# Patient Record
Sex: Female | Born: 1970 | Race: Black or African American | Hispanic: No | Marital: Single | Smoking: Never smoker
Health system: Southern US, Community
[De-identification: ages and names within clinical notes are randomized; demographics above are authoritative.]

## PROBLEM LIST (undated history)

## (undated) DIAGNOSIS — E119 Type 2 diabetes mellitus without complications: Secondary | ICD-10-CM

## (undated) DIAGNOSIS — E7800 Pure hypercholesterolemia, unspecified: Secondary | ICD-10-CM

## (undated) DIAGNOSIS — G43909 Migraine, unspecified, not intractable, without status migrainosus: Secondary | ICD-10-CM

## (undated) DIAGNOSIS — J309 Allergic rhinitis, unspecified: Secondary | ICD-10-CM

## (undated) DIAGNOSIS — J45909 Unspecified asthma, uncomplicated: Secondary | ICD-10-CM

## (undated) DIAGNOSIS — Z21 Asymptomatic human immunodeficiency virus [HIV] infection status: Secondary | ICD-10-CM

## (undated) DIAGNOSIS — B20 Human immunodeficiency virus [HIV] disease: Secondary | ICD-10-CM

## (undated) HISTORY — DX: Type 2 diabetes mellitus without complications: E11.9

## (undated) HISTORY — DX: Pure hypercholesterolemia, unspecified: E78.00

## (undated) HISTORY — DX: Human immunodeficiency virus (HIV) disease: B20

## (undated) HISTORY — PX: ADENOIDECTOMY: SUR15

## (undated) HISTORY — DX: Unspecified asthma, uncomplicated: J45.909

## (undated) HISTORY — DX: Migraine, unspecified, not intractable, without status migrainosus: G43.909

## (undated) HISTORY — PX: UTERINE FIBROID SURGERY: SHX826

## (undated) HISTORY — DX: Allergic rhinitis, unspecified: J30.9

## (undated) HISTORY — DX: Asymptomatic human immunodeficiency virus (hiv) infection status: Z21

---

## 2001-01-24 ENCOUNTER — Other Ambulatory Visit: Admission: RE | Admit: 2001-01-24 | Discharge: 2001-01-24 | Payer: Self-pay | Admitting: Obstetrics and Gynecology

## 2011-04-01 DIAGNOSIS — B20 Human immunodeficiency virus [HIV] disease: Secondary | ICD-10-CM | POA: Insufficient documentation

## 2012-10-25 DIAGNOSIS — M545 Low back pain, unspecified: Secondary | ICD-10-CM | POA: Insufficient documentation

## 2014-03-11 DIAGNOSIS — N939 Abnormal uterine and vaginal bleeding, unspecified: Secondary | ICD-10-CM | POA: Insufficient documentation

## 2015-03-05 ENCOUNTER — Other Ambulatory Visit: Payer: Self-pay | Admitting: Allergy

## 2015-03-05 ENCOUNTER — Telehealth: Payer: Self-pay | Admitting: Allergy

## 2015-03-05 NOTE — Telephone Encounter (Signed)
singulair approval faxed to [facility]

## 2015-03-05 NOTE — Telephone Encounter (Signed)
singulair has been approved for 2017

## 2015-04-16 ENCOUNTER — Encounter: Payer: Self-pay | Admitting: Pediatrics

## 2015-04-16 ENCOUNTER — Ambulatory Visit (INDEPENDENT_AMBULATORY_CARE_PROVIDER_SITE_OTHER): Payer: Medicare Other | Admitting: Pediatrics

## 2015-04-16 VITALS — BP 120/74 | HR 90 | Temp 98.4°F | Resp 17 | Ht 61.0 in | Wt 208.1 lb

## 2015-04-16 DIAGNOSIS — B20 Human immunodeficiency virus [HIV] disease: Secondary | ICD-10-CM

## 2015-04-16 DIAGNOSIS — J3089 Other allergic rhinitis: Secondary | ICD-10-CM | POA: Insufficient documentation

## 2015-04-16 DIAGNOSIS — L259 Unspecified contact dermatitis, unspecified cause: Secondary | ICD-10-CM | POA: Diagnosis not present

## 2015-04-16 DIAGNOSIS — J4540 Moderate persistent asthma, uncomplicated: Secondary | ICD-10-CM

## 2015-04-16 DIAGNOSIS — Z21 Asymptomatic human immunodeficiency virus [HIV] infection status: Secondary | ICD-10-CM

## 2015-04-16 LAB — PULMONARY FUNCTION TEST

## 2015-04-16 MED ORDER — MOMETASONE FUROATE 50 MCG/ACT NA SUSP: NASAL | Status: DC

## 2015-04-16 NOTE — Patient Instructions (Signed)
Continue Qvar 80 one puff twice a day Pro-air 2 puffs every 4 hours if needed for wheezing or coughing spells Montelukast -Singulair 10 mg once a day Instead of fluticasone nose spray try Nasonex 1 spray per nostril twice a day Hydroxyzine 25 mg once a day for runny nose or itching 1% hydrocortisone cream twice a day if needed to red itchy areas on the face

## 2015-04-16 NOTE — Progress Notes (Signed)
  [address] [place] [address] Dept: [phone]  FOLLOW UP NOTE  Patient ID: Amber Clark, female    DOB: 01-Oct-1970  Age: 46 y.o. MRN: [id number] Date of Office Visit: 04/16/2015  Assessment Chief Complaint: Asthma and Wheezing  HPI Ilamae Geng presents for follow-up of her asthma and allergic rhinitis. Her asthma is well controlled. Fluticasone does not help her nasal congestion as well as before.. She has been having a an itchy rash under the lower eyelids. She is not using hydroxyzine. Her HIV is well controlled and she does not have a viral load . Her diabetes is well controlled  Current medications are Singulair 10 mg once a day, Pro-air 2 puffs every 4 hours if needed, Qvar 80-one puff twice a day and fluticasone 1 spray per nostril twice a day. Her other medications are outlined in the chart   Drug Allergies:  Allergies  Allergen Reactions  . Sulfa Antibiotics Hives  . Ace Inhibitors Other (See Comments)    Makes heart race  . Lisinopril Hives  . Penicillins Other (See Comments)    Makes her feel bad.  . Codeine Itching    Physical Exam: BP 120/74 mmHg  Pulse 90  Temp(Src) 98.4 F (36.9 C) (Oral)  Resp 17  Ht  (1.549 m)  Wt 208 lb 1.8 oz (94.4 kg)  BMI 39.34 kg/m2   Physical Exam  Constitutional: She is oriented to person, place, and time. She appears well-developed and well-nourished.  HENT:  Eyes normal. Ears normal. Nose normal. Pharynx normal.  Neck: Neck supple.  Cardiovascular:  S1 and S2 normal no murmurs  Pulmonary/Chest:  Clear to percussion auscultation  Lymphadenopathy:    She has no cervical adenopathy.  Neurological: She is alert and oriented to person, place, and time.  Skin:  Mild erythema below lower eyelids  Psychiatric: She has a normal mood and affect. Her behavior is normal. Judgment and thought content normal.  Vitals reviewed.   Diagnostics:   FVC 1.72 L FEV1 1.48 L. Predicted FVC 2.72 L predicted FEV1 2.24  L-E shows a moderate reduction in the FVC  Assessment and Plan: 1. Moderate persistent asthma, uncomplicated   2. Other allergic rhinitis   3. HIV (human immunodeficiency virus infection) (HCC)   4. Dermatitis, contact   4.      Insulin-dependent diabetes  Meds ordered this encounter  Medications  . mometasone (NASONEX) 50 MCG/ACT nasal spray    Sig: Use 1 Spray Each Nostril Twice a Day    Dispense:  16 g    Refill:  5    Patient Instructions  Continue Qvar 80 one puff twice a day Pro-air 2 puffs every 4 hours if needed for wheezing or coughing spells Montelukast -Singulair 10 mg once a day Instead of fluticasone nose spray try Nasonex 1 spray per nostril twice a day Hydroxyzine 25 mg once a day for runny nose or itching 1% hydrocortisone cream twice a day if needed to red itchy areas on the face    Return in about 6 weeks (around 05/28/2015).    Thank you for the opportunity to care for this patient.  Please do not hesitate to contact me with questions.  Tonette Bihari, M.D.  [facility] [address] [place], [place] [address] [phone]

## 2015-05-08 ENCOUNTER — Other Ambulatory Visit: Payer: Self-pay

## 2015-05-08 MED ORDER — SINGULAIR 10 MG PO TABS: ORAL_TABLET | ORAL | Status: DC

## 2015-05-28 ENCOUNTER — Ambulatory Visit: Payer: Medicaid Other | Admitting: Pediatrics

## 2015-06-02 ENCOUNTER — Ambulatory Visit (INDEPENDENT_AMBULATORY_CARE_PROVIDER_SITE_OTHER): Payer: Medicare Other | Admitting: Pediatrics

## 2015-06-02 ENCOUNTER — Encounter: Payer: Self-pay | Admitting: Pediatrics

## 2015-06-02 VITALS — BP 102/60 | HR 82 | Temp 98.4°F | Resp 18

## 2015-06-02 DIAGNOSIS — Z21 Asymptomatic human immunodeficiency virus [HIV] infection status: Secondary | ICD-10-CM

## 2015-06-02 DIAGNOSIS — B20 Human immunodeficiency virus [HIV] disease: Secondary | ICD-10-CM

## 2015-06-02 DIAGNOSIS — J3089 Other allergic rhinitis: Secondary | ICD-10-CM | POA: Diagnosis not present

## 2015-06-02 DIAGNOSIS — J4540 Moderate persistent asthma, uncomplicated: Secondary | ICD-10-CM

## 2015-06-02 NOTE — Progress Notes (Signed)
  [address] [place] [address] Dept: [phone]  FOLLOW UP NOTE  Patient ID: Amber GlassingLeslie Verrilli, female    DOB: 03/17/1971  Age: 45 y.o. MRN: [id number] Date of Office Visit: 06/02/2015  Assessment Chief Complaint: Asthma  HPI Amber GlassingLeslie Nott presents for follow-up of asthma and allergic rhinitis. Her asthma has been well controlled. At times she has mild nasal congestion. Her HIV is well controlled and she does not have viral particles. Her diabetes is well controlled with insulin  Current medications are Qvar 80-one  puff twice a day, Pro-air 2 puffs every 4 hours if needed, montelukast 10 mg once a day, Nasonex 1 spray per nostril twice a day if needed and hydroxyzine 25 mg at night. Her other medications are outlined in the chart..   Drug Allergies:  Allergies  Allergen Reactions  . Sulfa Antibiotics Hives  . Ace Inhibitors Other (See Comments)    Makes heart race  . Lisinopril Hives  . Penicillins Other (See Comments)    Makes her feel bad.  . Codeine Itching    Physical Exam: BP 102/60 mmHg  Pulse 82  Temp(Src) 98.4 F (36.9 C) (Oral)  Resp 18   Physical Exam  Constitutional: She is oriented to person, place, and time. She appears well-developed and well-nourished.  HENT:  Eyes normal. Ears normal. Nose normal. Pharynx normal.  Neck: Neck supple.  Cardiovascular:  S1 and S2 normal no murmurs  Pulmonary/Chest:  Clear to percussion and auscultation  Lymphadenopathy:    She has no cervical adenopathy.  Neurological: She is alert and oriented to person, place, and time.  Skin:  Clear  Psychiatric: She has a normal mood and affect. Her behavior is normal. Judgment and thought content normal.  Vitals reviewed.   Diagnostics:   None  Assessment and Plan: 1. Moderate persistent asthma, uncomplicated   2. Other allergic rhinitis   3. HIV (human immunodeficiency virus infection) (HCC)   4.      Insulin-dependent diabetes      Patient Instructions    Continue on the treatment plan outlined above Call me if you're not doing well on this treatment plan    Return in about 1 year (around 06/01/2016).    Thank you for the opportunity to care for this patient.  Please do not hesitate to contact me with questions.  Tonette BihariJ. A. Bardelas, M.D.  [facility] [address] [place], [place] [address] [phone]

## 2015-06-02 NOTE — Patient Instructions (Signed)
Continue on the treatment plan outlined above Call me if you're not doing well on this treatment plan 

## 2015-07-12 ENCOUNTER — Emergency Department (HOSPITAL_BASED_OUTPATIENT_CLINIC_OR_DEPARTMENT_OTHER): Payer: Medicare Other

## 2015-07-12 ENCOUNTER — Emergency Department (HOSPITAL_BASED_OUTPATIENT_CLINIC_OR_DEPARTMENT_OTHER)
Admission: EM | Admit: 2015-07-12 | Discharge: 2015-07-12 | Disposition: A | Discharge status: Home / Self Care | Payer: Medicare Other | Attending: Emergency Medicine | Admitting: Emergency Medicine

## 2015-07-12 ENCOUNTER — Encounter (HOSPITAL_BASED_OUTPATIENT_CLINIC_OR_DEPARTMENT_OTHER): Payer: Self-pay | Admitting: Emergency Medicine

## 2015-07-12 DIAGNOSIS — Z880 Allergy status to penicillin: Secondary | ICD-10-CM | POA: Insufficient documentation

## 2015-07-12 DIAGNOSIS — Y998 Other external cause status: Secondary | ICD-10-CM | POA: Insufficient documentation

## 2015-07-12 DIAGNOSIS — W108XXA Fall (on) (from) other stairs and steps, initial encounter: Secondary | ICD-10-CM | POA: Diagnosis not present

## 2015-07-12 DIAGNOSIS — Y9301 Activity, walking, marching and hiking: Secondary | ICD-10-CM | POA: Insufficient documentation

## 2015-07-12 DIAGNOSIS — Z79899 Other long term (current) drug therapy: Secondary | ICD-10-CM | POA: Diagnosis not present

## 2015-07-12 DIAGNOSIS — J45909 Unspecified asthma, uncomplicated: Secondary | ICD-10-CM | POA: Insufficient documentation

## 2015-07-12 DIAGNOSIS — W19XXXA Unspecified fall, initial encounter: Secondary | ICD-10-CM

## 2015-07-12 DIAGNOSIS — Y92009 Unspecified place in unspecified non-institutional (private) residence as the place of occurrence of the external cause: Secondary | ICD-10-CM | POA: Diagnosis not present

## 2015-07-12 DIAGNOSIS — S99912A Unspecified injury of left ankle, initial encounter: Secondary | ICD-10-CM | POA: Diagnosis present

## 2015-07-12 DIAGNOSIS — Z794 Long term (current) use of insulin: Secondary | ICD-10-CM | POA: Insufficient documentation

## 2015-07-12 DIAGNOSIS — S92252A Displaced fracture of navicular [scaphoid] of left foot, initial encounter for closed fracture: Secondary | ICD-10-CM

## 2015-07-12 MED ORDER — IBUPROFEN 600 MG PO TABS: 600.0000 mg | ORAL_TABLET | Freq: Four times a day (QID) | ORAL | Status: DC | PRN

## 2015-07-12 MED ORDER — IBUPROFEN 400 MG PO TABS
600.0000 mg | ORAL_TABLET | Freq: Once | ORAL | Status: AC
  Administered 2015-07-12: 600 mg via ORAL
  Filled 2015-07-12: qty 1

## 2015-07-12 NOTE — ED Notes (Signed)
Patient reports that she fell down her steps, about 2 -3 steps. The patient reports that she hit her left side  Arm and leg pain

## 2015-07-12 NOTE — Discharge Instructions (Signed)
Take your medications as prescribed. I recommend rest, elevation and icing her foot for 15-20 minutes 3-4 times daily as needed for pain and swelling. Wear your splint for the next 7-10 days until your follow up with Orthopedics. Call the Orthopedic office listed above to schedule a follow up appointment in the next 7-10 days. Please return to the Emergency Department if symptoms worsen or new onset of fever, redness, swelling, warmth, numbness, tingling, weakness.

## 2015-07-12 NOTE — ED Notes (Signed)
Applied Ice pack.

## 2015-07-12 NOTE — ED Provider Notes (Signed)
CSN: [id number]     Arrival date & time 07/12/15  1037 History   First MD Initiated Contact with Patient 07/12/15 1101     Chief Complaint  Patient presents with  . Fall     (Consider location/radiation/quality/duration/timing/severity/associated sxs/prior Treatment) HPI   Patient is a 45 year old female past medical history of HIV, hypertension, diabetes who presents the ED status post fall with complaint of left ankle pain, onset prior to arrival. Patient reports she was walking down the steps at her home, tripped and fell down the last 2 steps resulting in her twisting and landing on her left ankle. Denies head injury or LOC. Patient reports having pain and swelling to her left ankle. Denies taking any medications prior to arrival. Denies fever, redness, warmth, numbness, tingling, weakness. Denies use of anticoagulants.   Past Medical History  Diagnosis Date  . Asthma    History reviewed. No pertinent past surgical history. History reviewed. No pertinent family history. Social History  Substance Use Topics  . Smoking status: Never Smoker   . Smokeless tobacco: Never Used  . Alcohol Use: No   OB History    No data available     Review of Systems  Musculoskeletal: Positive for joint swelling and arthralgias (left ankle).  Skin: Negative for wound.  Neurological: Negative for weakness and numbness.      Allergies  Sulfa antibiotics; Ace inhibitors; Lisinopril; Penicillins; and Codeine  Home Medications   Prior to Admission medications   Medication Sig Start Date End Date Taking? Authorizing Provider  ACCU-CHEK FASTCLIX LANCETS MISC CHECK SUGARS TID 04/08/15   Historical Provider, MD  amLODipine (NORVASC) 10 MG tablet TK 1 T PO  QD 03/24/15   Historical Provider, MD  clindamycin (CLEOCIN T) 1 % lotion APP AA OF FACE BID 02/18/15   Historical Provider, MD  diclofenac (VOLTAREN) 75 MG EC tablet TK 1 T PO BID WF FOR MENSTRUAL CRAMPS ON WORST DAYS OF PERIOD [facility] MONTH  03/03/15   Historical Provider, MD  ferrous sulfate 325 (65 FE) MG tablet TAKE 1 T PO ONCE D. 04/08/15   Historical Provider, MD  gabapentin (NEURONTIN) 300 MG capsule TK UP TO 2 CS PO ATN 03/21/15   Historical Provider, MD  ibuprofen (ADVIL,MOTRIN) 600 MG tablet Take 1 tablet (600 mg total) by mouth every 6 (six) hours as needed. 07/12/15   Barrett Henle, PA-C  Insulin Syringe-Needle U-100 (INSULIN SYRINGE .5CC/31GX5/16") 31G X 5/16" 0.5 ML MISC U UTD TO INJ D 03/24/15   Historical Provider, MD  ISENTRESS 400 MG tablet TK 1 T PO BID 02/24/15   Historical Provider, MD  LANTUS 100 UNIT/ML injection ADM 30 UNITS [place] QAM 03/02/15   Historical Provider, MD  Karlene Einstein 145 MCG CAPS capsule  04/08/15   Historical Provider, MD  Magnesium Oxide -Mg Supplement 250 MG TABS TK 1 T PO D 05/14/15   Historical Provider, MD  medroxyPROGESTERone (PROVERA) 10 MG tablet Reported on 06/02/2015 02/02/15   Historical Provider, MD  metFORMIN (GLUCOPHAGE) 500 MG tablet TK 2 T PO BID 04/06/15   Historical Provider, MD  mometasone (NASONEX) 50 MCG/ACT nasal spray Use 1 Spray Each Nostril Twice a Day 04/16/15   Fletcher Anon, MD  PROAIR HFA 108 (90 Base) MCG/ACT inhaler INL 2 PFS PO Q 4 TO 6 H PRF COUGH OR WHZ 01/31/15   Historical Provider, MD  promethazine (PHENERGAN) 25 MG tablet Take 25 mg by mouth every 6 (six) hours as needed for nausea or vomiting.  Historical Provider, MD  QVAR 80 MCG/ACT inhaler INHALE 2 PUFFS PO BID 01/30/15   Historical Provider, MD  ranitidine (ZANTAC) 300 MG capsule TK 1 C PO HS 03/31/15   Historical Provider, MD  rizatriptan (MAXALT-MLT) 10 MG disintegrating tablet Take 10 mg by mouth as needed for migraine. May repeat in 2 hours if needed    Historical Provider, MD  simvastatin (ZOCOR) 40 MG tablet TK 1 T PO D 02/26/15   Historical Provider, MD  SINGULAIR 10 MG tablet TK 1 T PO QD COU OR WHZ 05/08/15   Fletcher Anon, MD  TRUVADA 200-300 MG tablet TK 1 T PO D 02/26/15   Historical Provider, MD   valACYclovir (VALTREX) 1000 MG tablet TK 1 T PO D 02/23/15   Historical Provider, MD  VICTOZA 18 MG/3ML SOPN ADM 1.8 MG [place] D 04/08/15   Historical Provider, MD   BP 143/90 mmHg  Pulse 101  Temp(Src) 98.3 F (36.8 C) (Oral)  Resp 18  Ht  (1.549 m)  Wt 87.261 kg  BMI 36.37 kg/m2  SpO2 99%  LMP 07/05/2015 Physical Exam  Constitutional: She is oriented to person, place, and time. She appears well-developed and well-nourished.  HENT:  Head: Normocephalic and atraumatic. Head is without raccoon's eyes, without Battle's sign, without abrasion, without contusion and without laceration.  Eyes: Conjunctivae and EOM are normal. Right eye exhibits no discharge. Left eye exhibits no discharge. No scleral icterus.  Neck: Normal range of motion. Neck supple.  Cardiovascular: Normal rate.   HR 96  Pulmonary/Chest: Effort normal.  Musculoskeletal: She exhibits edema and tenderness.       Left ankle: She exhibits decreased range of motion (due to pain and swelling) and swelling. She exhibits no ecchymosis, no deformity, no laceration and normal pulse. Tenderness (anterior and posterior ankle). Lateral malleolus and medial malleolus tenderness found.  Sensation grossly intact. 2+ DP pulses. Cap refill <2. Pt able to wiggle toes. FROM of left knee. No TTP of left tib/fib or knee.   Neurological: She is alert and oriented to person, place, and time.  Skin: Skin is warm and dry.  Nursing note and vitals reviewed.   ED Course  Procedures (including critical care time) Labs Review Labs Reviewed - No data to display  Imaging Review Dg Tibia/fibula Left  07/12/2015  CLINICAL DATA:  45 year old female with history of trauma after a fall down some steps at home today complaining of pain in the left leg. EXAM: LEFT TIBIA AND FIBULA - 2 VIEW COMPARISON:  No priors. FINDINGS: Two views of the left tibia and fibula demonstrate no acute displaced fractures. Extensive soft tissue swelling overlying the  lateral malleolus. IMPRESSION: 1. No acute radiographic abnormality of the left tibia or fibula. Electronically Signed   By: Trudie Reed M.D.   On: 07/12/2015 12:18   Dg Ankle Complete Left  07/12/2015  CLINICAL DATA:  45 year old female with history of trauma from a fall down steps at home today complaining of pain in the left ankle. EXAM: LEFT ANKLE COMPLETE - 3+ VIEW COMPARISON:  No priors. FINDINGS: Tiny osseous fragment adjacent to the dorsal aspect of the proximal navicular bone, likely to represent a small avulsion fracture. Remaining bones of the ankle are otherwise intact. Extensive soft tissue swelling, most notable over the lateral malleolus. IMPRESSION: 1. Tiny dorsal avulsion fracture from the proximal aspect of the navicular bone. 2. Extensive soft tissue swelling overlying the lateral malleolus. Electronically Signed   By: Trudie Reed  M.D.   On: 07/12/2015 12:17   I have personally reviewed and evaluated these images and lab results as part of my medical decision-making.   EKG Interpretation None      MDM   Final diagnoses:  Fall, initial encounter  Left navicular fracture of foot, closed, initial encounter    Patient presents status post mechanical fall with reported left ankle pain and swelling. Denies head injury or LOC. VSS. Exam revealed mild swelling and TTP of left ankle with dec ROM, left foot/ankle otherwise neurovascularly intact. Pt given ice and ibuprofen in the ED. left tib-fib x-ray negative. Left ankle x-ray revealed tiny dorsal avulsion fracture from proximal navicular bone, soft tissue swelling of lateral malleolus. Plan to apply short leg splint on pt, advise pt to be non-weight bearing for 7-10 days until she follows up with ortho in 7-10 days. Pt given ortho follow up. Plan to d/c pt home with NSAIDs, RICE protocol and discussed symptomatic tx.   Evaluation does not show pathology requring ongoing emergent intervention or admission. Pt is  hemodynamically stable and mentating appropriately. Discussed findings/results and plan with patient/guardian, who agrees with plan. All questions answered. Return precautions discussed and outpatient follow up given.      Satira Sarkicole Elizabeth Tenake[place] Springs[place]au, [place] JerseyPA-C 07/12/15 1249  Jerelyn ScottMartha Linker, MD 07/12/15 [phone]

## 2015-08-31 DIAGNOSIS — G43009 Migraine without aura, not intractable, without status migrainosus: Secondary | ICD-10-CM | POA: Insufficient documentation

## 2015-12-03 ENCOUNTER — Ambulatory Visit: Payer: Medicare Other | Admitting: Pediatrics

## 2015-12-07 ENCOUNTER — Ambulatory Visit (INDEPENDENT_AMBULATORY_CARE_PROVIDER_SITE_OTHER): Payer: Medicare Other | Admitting: Pediatrics

## 2015-12-07 ENCOUNTER — Encounter: Payer: Self-pay | Admitting: Pediatrics

## 2015-12-07 VITALS — BP 118/60 | HR 96 | Temp 98.6°F | Resp 18 | Ht 60.3 in | Wt 205.0 lb

## 2015-12-07 DIAGNOSIS — J3081 Allergic rhinitis due to animal (cat) (dog) hair and dander: Secondary | ICD-10-CM | POA: Diagnosis not present

## 2015-12-07 DIAGNOSIS — Z21 Asymptomatic human immunodeficiency virus [HIV] infection status: Secondary | ICD-10-CM | POA: Diagnosis not present

## 2015-12-07 DIAGNOSIS — E119 Type 2 diabetes mellitus without complications: Secondary | ICD-10-CM | POA: Insufficient documentation

## 2015-12-07 DIAGNOSIS — J4530 Mild persistent asthma, uncomplicated: Secondary | ICD-10-CM | POA: Diagnosis not present

## 2015-12-07 DIAGNOSIS — B20 Human immunodeficiency virus [HIV] disease: Secondary | ICD-10-CM

## 2015-12-07 DIAGNOSIS — E785 Hyperlipidemia, unspecified: Secondary | ICD-10-CM | POA: Insufficient documentation

## 2015-12-07 DIAGNOSIS — I10 Essential (primary) hypertension: Secondary | ICD-10-CM | POA: Diagnosis not present

## 2015-12-07 DIAGNOSIS — Z794 Long term (current) use of insulin: Secondary | ICD-10-CM

## 2015-12-07 DIAGNOSIS — F3289 Other specified depressive episodes: Secondary | ICD-10-CM | POA: Insufficient documentation

## 2015-12-07 DIAGNOSIS — F329 Major depressive disorder, single episode, unspecified: Secondary | ICD-10-CM | POA: Insufficient documentation

## 2015-12-07 NOTE — Progress Notes (Signed)
  [address] [place] [address] Dept: [phone]  FOLLOW UP NOTE  Patient ID: Amber GlassingLeslie Clark, female    DOB: 03/07/1971  Age: 45 y.o. MRN: [id number] Date of Office Visit: 12/07/2015  Assessment  Chief Complaint: Asthma (Doing well)  HPI Amber Clark presents for follow-up of asthma and allergic rhinitis. Her HIV is well controlled and she does not have viral particles. Her diabetes is well controlled. She is around a dog and she gets itchy around the dog. Her asthma is well controlled.  Current medications are Qvar 80 one puff twice a day, Pro-air 2 puffs every 4 hours if needed, montelukast 10 mg once a day, Nasonex 1 spray per nostril twice a day if needed and hydroxyzine 25 mg at night. Her other medications are outlined in the chart   Drug Allergies:  Allergies  Allergen Reactions  . Sulfa Antibiotics Hives  . Ace Inhibitors Other (See Comments)    Makes heart race  . Lisinopril Hives  . Penicillins Other (See Comments)    Makes her feel bad.  . Codeine Itching    Physical Exam: BP 118/60 (BP Location: Right Arm, Patient Position: Sitting, Cuff Size: Large)   Pulse 96   Temp 98.6 F (37 C) (Oral)   Resp 18   Ht 5' 0.3" (1.532 m)   Wt 205 lb (93 kg)   SpO2 97%   BMI 39.64 kg/m    Physical Exam  Constitutional: She is oriented to person, place, and time. She appears well-developed and well-nourished.  HENT:  Eyes normal. Ears normal. Nose normal. Pharynx normal.  Neck: Neck supple.  Cardiovascular:  S1 and S2 normal no murmurs  Pulmonary/Chest:  Clear to percussion and auscultation  Lymphadenopathy:    She has no cervical adenopathy.  Neurological: She is alert and oriented to person, place, and time.  Psychiatric: She has a normal mood and affect. Her behavior is normal. Judgment and thought content normal.  Vitals reviewed.   Diagnostics:  FVC 1.97 L FEV1 1.69 L. Predicted FVC 2.55 L predicted FEV1 1.97 L-this shows some mild reduction in the  forced vital capacity  Assessment and Plan: 1. Mild persistent asthma, uncomplicated   2. Allergic rhinitis due to animal hair and dander   3. HIV (human immunodeficiency virus infection) (HCC)   4. Essential hypertension   5      Diabetes well controlled on insulin  No orders of the defined types were placed in this encounter.   Patient Instructions  Cetirizine 10 mg in the morning for runny nose or itching Continue on your current medications Call me if you are not doing well on this treatment plan   Return in about 6 months (around 06/05/2016).    Thank you for the opportunity to care for this patient.  Please do not hesitate to contact me with questions.  Tonette BihariJ. A. Deanndra Kirley, M.D.  [facility] [address] [place], [place] [address] [phone]

## 2015-12-07 NOTE — Patient Instructions (Signed)
Cetirizine 10 mg in the morning for runny nose or itching Continue on your current medications Call me if you are not doing well on this treatment plan

## 2016-02-23 ENCOUNTER — Encounter: Payer: Self-pay | Admitting: Pediatrics

## 2016-02-23 ENCOUNTER — Ambulatory Visit (INDEPENDENT_AMBULATORY_CARE_PROVIDER_SITE_OTHER): Payer: Medicare Other | Admitting: Pediatrics

## 2016-02-23 VITALS — BP 118/72 | HR 97 | Temp 98.4°F | Resp 20

## 2016-02-23 DIAGNOSIS — J4540 Moderate persistent asthma, uncomplicated: Secondary | ICD-10-CM

## 2016-02-23 DIAGNOSIS — Z794 Long term (current) use of insulin: Secondary | ICD-10-CM | POA: Diagnosis not present

## 2016-02-23 DIAGNOSIS — E119 Type 2 diabetes mellitus without complications: Secondary | ICD-10-CM | POA: Diagnosis not present

## 2016-02-23 DIAGNOSIS — B20 Human immunodeficiency virus [HIV] disease: Secondary | ICD-10-CM | POA: Diagnosis not present

## 2016-02-23 DIAGNOSIS — J3081 Allergic rhinitis due to animal (cat) (dog) hair and dander: Secondary | ICD-10-CM | POA: Diagnosis not present

## 2016-02-23 DIAGNOSIS — I10 Essential (primary) hypertension: Secondary | ICD-10-CM

## 2016-02-23 MED ORDER — HYDROXYZINE HCL 25 MG PO TABS: 25.0000 mg | ORAL_TABLET | Freq: Every day | ORAL | 5 refills | Status: DC

## 2016-02-23 MED ORDER — MOMETASONE FUROATE 50 MCG/ACT NA SUSP: NASAL | 5 refills | Status: DC

## 2016-02-23 MED ORDER — QVAR 80 MCG/ACT IN AERS: 2.0000 | INHALATION_SPRAY | Freq: Two times a day (BID) | RESPIRATORY_TRACT | 5 refills | Status: DC

## 2016-02-23 NOTE — Patient Instructions (Signed)
Increase Qvar 80-to 2 puffs twice day for 2 weeks and then go back to 1 puff twice a day if you're cough and wheezing free Pro-air 2 puffs every 4 hours if needed for wheezing or coughing spells Montelukast 10 mg once a day for coughing or wheezing Nasonex 1 spray per nostril twice a day if needed for stuffy nose Hydroxyzine 25 mg at night for runny nose or itching Call me if you're not doing well on this treatment plan

## 2016-02-23 NOTE — Progress Notes (Signed)
[address] [place] [address] Dept: [phone]  FOLLOW UP NOTE  Patient ID: Amber GlassingLeslie Clark, female    DOB: 08/16/1970  Age: 45 y.o. MRN: [id number] Date of Office Visit: 02/23/2016  Assessment  Chief Complaint: Asthma (2 month follow up)  HPI Amber GlassingLeslie Clark presents for follow-up of asthma and allergic rhinitis. She had a cold 2 weeks ago and has been having some coughing. Her diabetes is well controlled. If she is around the dog she gets an itchy rash. Her HIV is well controlled and she does not have viral particles..  Current medications are outlined in the chart. I will outline  her asthma and rhinitis medications in the afternoon visit summary.   Drug Allergies:  Allergies  Allergen Reactions  . Sulfa Antibiotics Hives  . Ace Inhibitors Other (See Comments)    Makes heart race  . Lisinopril Hives  . Penicillins Other (See Comments)    Makes her feel bad.  . Codeine Itching    Physical Exam: BP 118/72   Pulse 97   Temp 98.4 F (36.9 C) (Oral)   Resp 20   SpO2 96%    Physical Exam  Constitutional: She is oriented to person, place, and time. She appears well-developed and well-nourished.  HENT:  Eyes normal. Ears normal. Nose mild swelling of nasal  turbinates. Pharynx normal.  Neck: Neck supple.  Cardiovascular:  S1 and S2 normal no murmurs  Pulmonary/Chest:  Clear to percussion and auscultation  Lymphadenopathy:    She has no cervical adenopathy.  Neurological: She is alert and oriented to person, place, and time.  Psychiatric: She has a normal mood and affect. Her behavior is normal. Judgment and thought content normal.  Vitals reviewed.   Diagnostics:  FVC 1.95 L FEV1 1.53 L. Predicted FVC 2.58 L predicted FEV1 2.12 L. -This shows a mild reduction in the forced vital capacity but stable for her  Assessment and Plan: 1. Moderate persistent asthma without complication   2. HIV (human immunodeficiency virus infection) (HCC)   3. 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 Essential hypertension   5. Type 2 diabetes mellitus without complication, with long-term current use of insulin (HCC)     Meds ordered this encounter  Medications  . mometasone (NASONEX) 50 MCG/ACT nasal spray    Sig: Use 1 Spray Each Nostril Twice a Day    Dispense:  16 g    Refill:  5  . hydrOXYzine (ATARAX/VISTARIL) 25 MG tablet    Sig: Take 1 tablet (25 mg total) by mouth at bedtime.    Dispense:  30 tablet    Refill:  5  . QVAR 80 MCG/ACT inhaler    Sig: Inhale 2 puffs into the lungs 2 (two) times daily.    Dispense:  1 Inhaler    Refill:  5    Patient Instructions  Increase Qvar 80-to 2 puffs twice day for 2 weeks and then go back to 1 puff twice a day if you're cough and wheezing free Pro-air 2 puffs every 4 hours if needed for wheezing or coughing spells Montelukast 10 mg once a day for coughing or wheezing Nasonex 1 spray per nostril twice a day if needed for stuffy nose Hydroxyzine 25 mg at night for runny nose or itching Call me if you're not doing well on this treatment plan   Return in about 6 months (around 08/22/2016).    Thank you for the opportunity to care for this  patient.  Please do not hesitate to contact me with questions.  Tonette BihariJ. A. Bardelas, M.D.  [facility] [address] [place], [place] [address] [phone]

## 2016-03-11 ENCOUNTER — Other Ambulatory Visit: Payer: Self-pay

## 2016-03-11 MED ORDER — QVAR 80 MCG/ACT IN AERS: 2.0000 | INHALATION_SPRAY | Freq: Two times a day (BID) | RESPIRATORY_TRACT | 0 refills | Status: DC

## 2016-03-11 NOTE — Telephone Encounter (Signed)
Refill for QVar denied, pt. Needs an OV

## 2016-06-06 ENCOUNTER — Ambulatory Visit: Payer: Medicare Other | Admitting: Pediatrics

## 2016-06-20 ENCOUNTER — Encounter: Payer: Self-pay | Admitting: Pediatrics

## 2016-06-20 ENCOUNTER — Ambulatory Visit (INDEPENDENT_AMBULATORY_CARE_PROVIDER_SITE_OTHER): Payer: Medicare Other | Admitting: Pediatrics

## 2016-06-20 VITALS — BP 110/66 | HR 86 | Temp 98.7°F | Resp 16

## 2016-06-20 DIAGNOSIS — J4540 Moderate persistent asthma, uncomplicated: Secondary | ICD-10-CM

## 2016-06-20 DIAGNOSIS — E119 Type 2 diabetes mellitus without complications: Secondary | ICD-10-CM | POA: Diagnosis not present

## 2016-06-20 DIAGNOSIS — B20 Human immunodeficiency virus [HIV] disease: Secondary | ICD-10-CM | POA: Diagnosis not present

## 2016-06-20 DIAGNOSIS — Z794 Long term (current) use of insulin: Secondary | ICD-10-CM

## 2016-06-20 DIAGNOSIS — J3089 Other allergic rhinitis: Secondary | ICD-10-CM

## 2016-06-20 DIAGNOSIS — I10 Essential (primary) hypertension: Secondary | ICD-10-CM

## 2016-06-20 MED ORDER — ALBUTEROL SULFATE HFA 108 (90 BASE) MCG/ACT IN AERS: 2.0000 | INHALATION_SPRAY | RESPIRATORY_TRACT | 5 refills | Status: DC | PRN

## 2016-06-20 MED ORDER — LORATADINE 10 MG PO TABS: 10.0000 mg | ORAL_TABLET | Freq: Every day | ORAL | 5 refills | Status: DC

## 2016-06-20 MED ORDER — MOMETASONE FUROATE 50 MCG/ACT NA SUSP: NASAL | 5 refills | Status: DC

## 2016-06-20 MED ORDER — OLOPATADINE HCL 0.2 % OP SOLN: OPHTHALMIC | 5 refills | Status: DC

## 2016-06-20 MED ORDER — SINGULAIR 10 MG PO TABS: ORAL_TABLET | ORAL | 5 refills | Status: DC

## 2016-06-20 MED ORDER — BUDESONIDE-FORMOTEROL FUMARATE 80-4.5 MCG/ACT IN AERO: 2.0000 | INHALATION_SPRAY | Freq: Two times a day (BID) | RESPIRATORY_TRACT | 5 refills | Status: DC

## 2016-06-20 NOTE — Progress Notes (Signed)
[address] [place] [address] Dept: [phone]  FOLLOW UP NOTE  Patient ID: Amber GlassingLeslie Ewen, female    DOB: 03/01/1971  Age: 46 y.o. MRN: [id number] Date of Office Visit: 06/20/2016  Assessment  Chief Complaint: Asthma (doing well)  HPI Amber GlassingLeslie Gentry presents for follow-up of asthma and allergic rhinitis. Because of formulary issues her family doctor changed Qvar 80 one puff twice a day to Arnuity Ellipta 100  one puff every 24 hours. She became very fatigued from the ArnuityEllipta which contains fluticasone furoate which is a very potent Inhaled steroid Her nasal symptoms are under control. Her HIV is under control and there are no viral particles   Current medications are outlined in the chart but her asthma and rhinitis medications will be outlined in her after visit summary   Drug Allergies:  Allergies  Allergen Reactions  . Sulfa Antibiotics Hives  . Sulfasalazine Hives  . Ace Inhibitors Other (See Comments)    Makes heart race  . Arnuity Ellipta [Fluticasone Propionate (Inhal)]     100 mcg fatigue  . Lisinopril Hives  . Penicillins Other (See Comments)    Makes her feel bad.  . Codeine Itching    Physical Exam: BP 110/66 (BP Location: Left Arm, Patient Position: Sitting, Cuff Size: Normal)   Pulse 86   Temp 98.7 F (37.1 C) (Oral)   Resp 16   SpO2 97%    Physical Exam  Constitutional: She is oriented to person, place, and time. She appears well-developed and well-nourished.  HENT:  Eyes normal. Ears normal. Nose normal. Pharynx normal.  Neck: Neck supple.  Cardiovascular:  S1 and S2 normal , no murmur  Pulmonary/Chest:  Clear to percussion and auscultation  Lymphadenopathy:    She has no cervical adenopathy.  Neurological: She is alert and oriented to person, place, and time.  Psychiatric: She has a normal mood and affect. Her behavior is normal. Judgment and thought content normal.  Vitals reviewed.   Diagnostics:  FVC 1.88 L FEV1 1.65 L.  Predicted FVC 2.58 L predicted FEV1 2.12 L-this shows a minimal reduction in the forced vital capacity  Assessment and Plan: 1. Moderate persistent asthma without complication   2. Other allergic rhinitis   3. Essential hypertension   4. HIV (human immunodeficiency virus infection) (HCC)   5. Type 2 diabetes mellitus without complication, with long-term current use of insulin (HCC)     Meds ordered this encounter  Medications  . budesonide-formoterol (SYMBICORT) 80-4.5 MCG/ACT inhaler    Sig: Inhale 2 puffs into the lungs 2 (two) times daily.    Dispense:  1 Inhaler    Refill:  5    To prevent cough or wheeze.  Marland Kitchen. albuterol (VENTOLIN HFA) 108 (90 Base) MCG/ACT inhaler    Sig: Inhale 2 puffs into the lungs every 4 (four) hours as needed for wheezing or shortness of breath.    Dispense:  1 Inhaler    Refill:  5  . loratadine (CLARITIN) 10 MG tablet    Sig: Take 1 tablet (10 mg total) by mouth daily.    Dispense:  34 tablet    Refill:  5    For runny nose or itching  . Olopatadine HCl 0.2 % SOLN    Sig: Instill one drop into each eye twice daily    Dispense:  2.5 mL    Refill:  5    For itchy eyes  . mometasone (NASONEX) 50 MCG/ACT nasal spray    Sig: Use  1 Spray Each Nostril Twice a Day    Dispense:  16 g    Refill:  5  . SINGULAIR 10 MG tablet    Sig: One tablet by mouth once each evening    Dispense:  34 tablet    Refill:  5    For cough or wheeze  . mometasone (NASONEX) 50 MCG/ACT nasal spray    Sig: 1 spray per nostril twice a day if needed.    Dispense:  17 g    Refill:  5    For stuffy nose    Patient Instructions  Claritin 10 mg in the morning for itching and hydroxyzine 25 mg at night Nasonex 1 spray per nostril twice a day if needed for stuffy nose Montelukast 10 mg once a day for coughing or wheezing Ventolin 2 puffs every 4 hours if needed for wheezing or coughing spells Call me if you are not doing well on this treatment plan Symbicort 80-2 puffs every 12  hours to prevent coughing or wheezing Since she became fatigued from fluticasone furoate in BreoEllipta , I want to decrease her dosages of inhaled steroids . Fluticasone furoate is a very potent inhaled steroid and is associated with more side effects   Return in about 3 months (around 09/20/2016).    Thank you for the opportunity to care for this patient.  Please do not hesitate to contact me with questions.  Tonette Bihari, M.D.  [facility] [address] [place], [place] [address] [phone]

## 2016-06-20 NOTE — Patient Instructions (Addendum)
Claritin 10 mg in the morning for itching and hydroxyzine 25 mg at night Nasonex 1 spray per nostril twice a day if needed for stuffy nose Montelukast 10 mg once a day for coughing or wheezing Ventolin 2 puffs every 4 hours if needed for wheezing or coughing spells Call me if you are not doing well on this treatment plan Symbicort 80-2 puffs every 12 hours to prevent coughing or wheezing Since she became fatigued from fluticasone furoate in BreoEllipta , I want to decrease her dosages of inhaled steroids . Fluticasone furoate is a very potent inhaled steroid and is associated with more side effects

## 2016-07-06 IMAGING — DX DG ANKLE COMPLETE 3+V*L*
3 series · 3 of 3 positions shown · non-contrast
Comparison: No priors.

CLINICAL DATA: 44-year-old female with history of trauma from a
fall down steps at home today complaining of pain in the left ankle.

EXAM:
LEFT ANKLE COMPLETE - 3+ VIEW

[ankle ap]
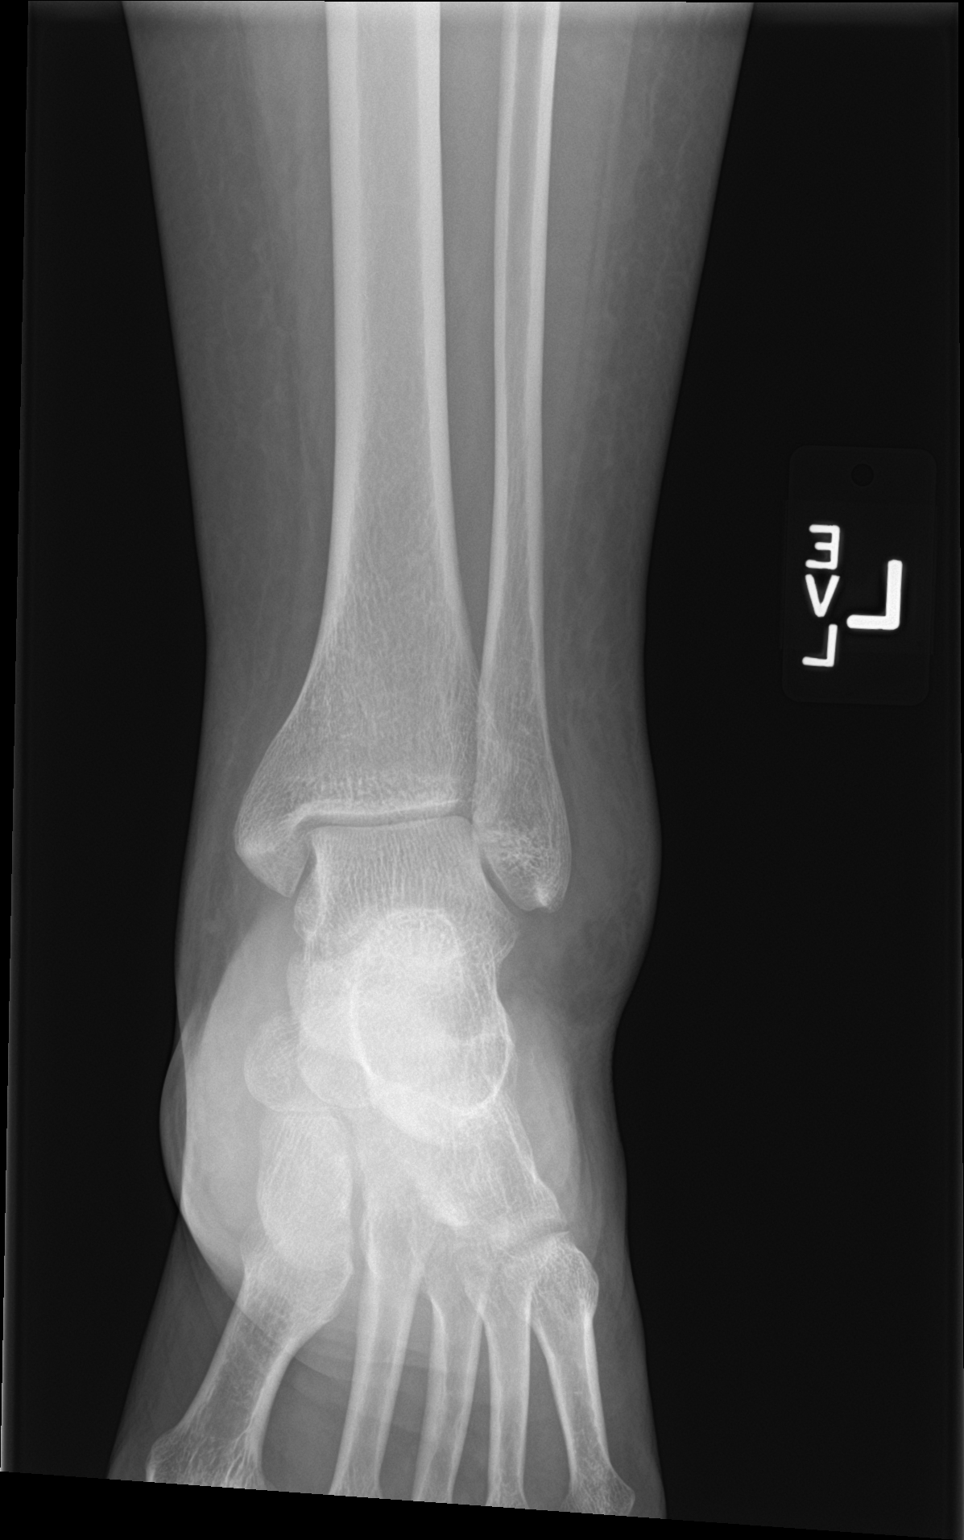

[ankle obl]
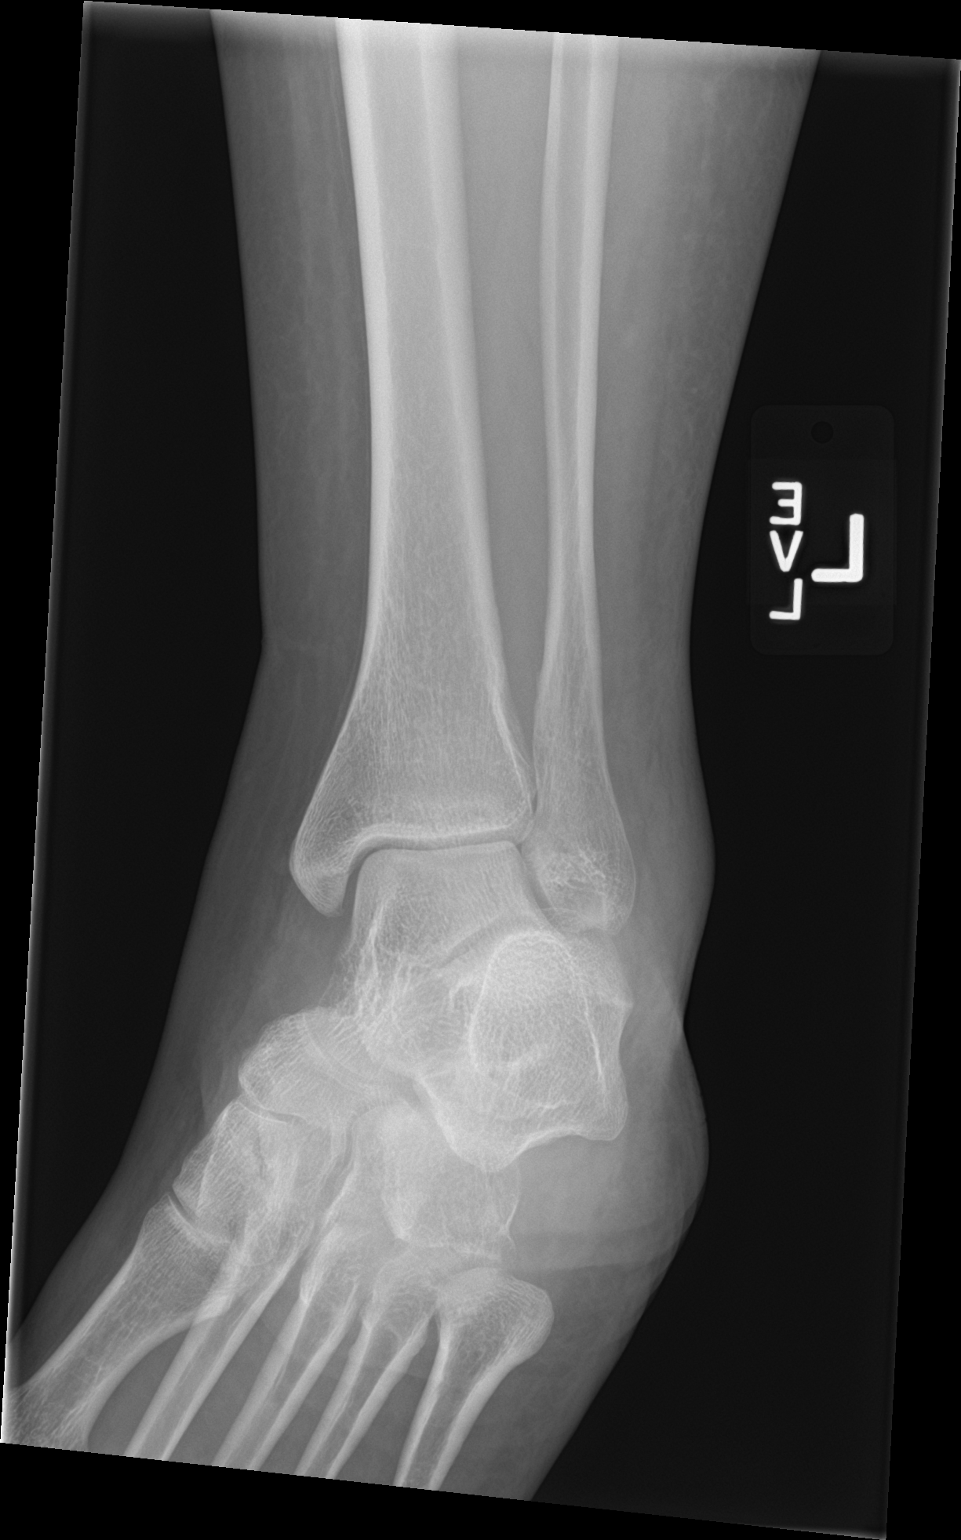

[ankle lat]
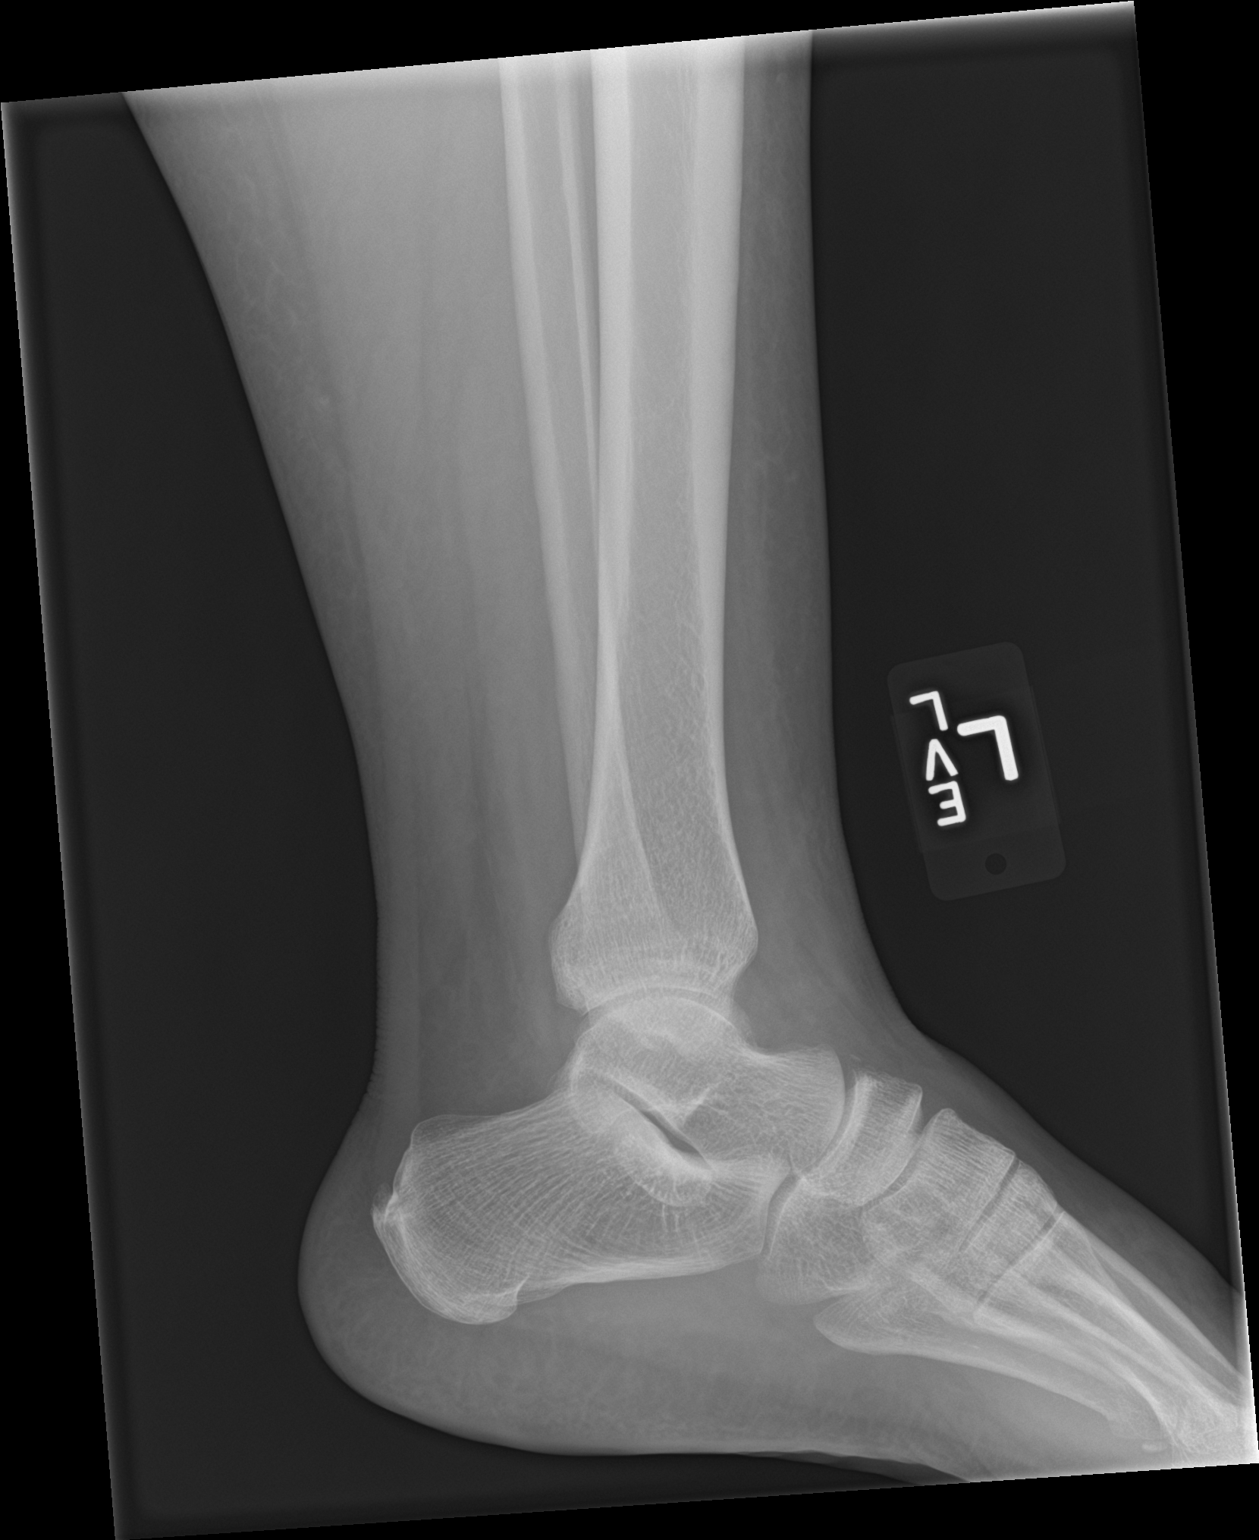

[3 of 3 positions shown; findings below may reference images not displayed]

FINDINGS: Tiny osseous fragment adjacent to the dorsal aspect of the proximal
navicular bone, likely to represent a small avulsion fracture.
Remaining bones of the ankle are otherwise intact. Extensive soft
tissue swelling, most notable over the lateral malleolus.
IMPRESSION: 1. Tiny dorsal avulsion fracture from the proximal aspect of the
navicular bone.
2. Extensive soft tissue swelling overlying the lateral malleolus.

## 2016-08-09 ENCOUNTER — Telehealth: Payer: Self-pay

## 2016-08-09 NOTE — Telephone Encounter (Signed)
Patient is having SOB and throat feels full/swollen. Having a hard time breathing.  We do not have any appointments available today.  Spoke with Dr. Beaulah DinningBardelas.  He states to call her primary doctor or go to the emergency room immediately. Informed patient.  Dr. Beaulah DinningBardelas aware.

## 2016-08-09 NOTE — Telephone Encounter (Signed)
Agree with my recommendations. Call us if she is not improving

## 2016-08-09 NOTE — Telephone Encounter (Signed)
Patient informed. 

## 2016-09-20 ENCOUNTER — Other Ambulatory Visit: Payer: Self-pay | Admitting: Allergy

## 2016-09-20 ENCOUNTER — Encounter: Payer: Self-pay | Admitting: Allergy and Immunology

## 2016-09-20 ENCOUNTER — Ambulatory Visit (INDEPENDENT_AMBULATORY_CARE_PROVIDER_SITE_OTHER): Payer: Medicare Other | Admitting: Allergy and Immunology

## 2016-09-20 VITALS — BP 104/70 | HR 70 | Temp 98.1°F | Resp 16

## 2016-09-20 DIAGNOSIS — J4541 Moderate persistent asthma with (acute) exacerbation: Secondary | ICD-10-CM | POA: Diagnosis not present

## 2016-09-20 DIAGNOSIS — J3089 Other allergic rhinitis: Secondary | ICD-10-CM | POA: Diagnosis not present

## 2016-09-20 MED ORDER — BUDESONIDE-FORMOTEROL FUMARATE 160-4.5 MCG/ACT IN AERO: 2.0000 | INHALATION_SPRAY | Freq: Two times a day (BID) | RESPIRATORY_TRACT | 5 refills | Status: DC

## 2016-09-20 MED ORDER — HYDROXYZINE HCL 25 MG PO TABS: 25.0000 mg | ORAL_TABLET | Freq: Every day | ORAL | 5 refills | Status: AC

## 2016-09-20 NOTE — Assessment & Plan Note (Signed)
Currently with suboptimal control.  We will step up therapy at this time.  A prescription has been provided for Symbicort (budesonide/formoterol) 160/4.5 g, 2 inhalations twice a day. To maximize pulmonary deposition, a spacer has been provided along with instructions for its proper administration with an HFA inhaler.  Discontinue Symbicort 80/4.5 g.  Continue albuterol HFA, 1-2 inhalations every 4-6 hours as needed.  The patient has been asked to contact me if her symptoms persist or progress. Otherwise, she may return for follow up in 4 months.

## 2016-09-20 NOTE — Assessment & Plan Note (Signed)
   Continue appropriate allergen avoidance measures.  I recommended increasing Nasonex from daily to twice a day dosing as needed.  I have also recommended nasal saline spray (i.e., Simply Saline) or nasal saline lavage (i.e., NeilMed) as needed and prior to medicated nasal sprays.

## 2016-09-20 NOTE — Progress Notes (Signed)
Follow-up Note  RE: Amber Clark MRN: [id number] DOB: [birth year] Date of Office Visit: 09/20/2016  Primary care provider: Caffie Damme, MD Referring provider: Caffie Damme, MD  History of present illness: Amber Clark is a 46 y.o. female with persistent asthma and allergic rhinitis presenting today for follow up.  She was last seen in this clinic on 06/20/2016.  At her last visit she was switched from Arnuity to Symbicort 80-4.5 g, 2 inhalations twice a day.  She admits that she has not been using a spacer device.  While she has noted some improvement in her asthma, she still experiences nocturnal awakenings due to lower respiratory symptoms 2 or 3 nights per week on average and requires albuterol rescue during the day a few times per week.  Despite using Nasonex once daily, she still experiences persistent nasal congestion.  She is currently finishing up a course of antibiotics and prednisone for a sinus infection.    Assessment and plan: Moderate persistent asthma Currently with suboptimal control.  We will step up therapy at this time.  A prescription has been provided for Symbicort (budesonide/formoterol) 160/4.5 g,  2 inhalations twice a day. To maximize pulmonary deposition, a spacer has been provided along with instructions for its proper administration with an HFA inhaler.  Discontinue Symbicort 80/4.5 g.  Continue albuterol HFA, 1-2 inhalations every 4-6 hours as needed.  The patient has been asked to contact me if her symptoms persist or progress. Otherwise, she may return for follow up in 4 months.  Other allergic rhinitis  Continue appropriate allergen avoidance measures.  I recommended increasing Nasonex from daily to twice a day dosing as needed.  I have also recommended nasal saline spray (i.e., Simply Saline) or nasal saline lavage (i.e., NeilMed) as needed and prior to medicated nasal sprays.   Meds ordered this encounter  Medications  .  budesonide-formoterol (SYMBICORT) 160-4.5 MCG/ACT inhaler    Sig: Inhale 2 puffs into the lungs 2 (two) times daily. Use with spacer. Rinse, gargle and spit out after use    Dispense:  1 Inhaler    Refill:  5    Diagnostics: Spirometry reveals an FVC of 1.99 L and an FEV1 of 1.66 L (74% predicted) with 100 mL (6%) postbronchodilator improvement.  Please see scanned spirometry results for details.    Physical examination: Blood pressure 104/70, pulse 70, temperature 98.1 F (36.7 C), temperature source Oral, resp. rate 16.  General: Alert, interactive, in no acute distress. HEENT: TMs pearly gray, turbinates moderately edematous without discharge, post-pharynx mildly erythematous. Neck: Supple without lymphadenopathy. Lungs: Mildly decreased breath sounds bilaterally without wheezing, rhonchi or rales. CV: Normal S1, S2 without murmurs. Skin: Warm and dry, without lesions or rashes.  The following portions of the patient's history were reviewed and updated as appropriate: allergies, current medications, past family history, past medical history, past social history, past surgical history and problem list.  Allergies as of 09/20/2016      Reactions   Sulfa Antibiotics Hives   Sulfasalazine Hives   Ace Inhibitors Other (See Comments)   Makes heart race   Arnuity Ellipta [fluticasone Propionate (inhal)]    100 mcg fatigue   Lisinopril Hives   Penicillins Other (See Comments)   Makes her feel bad.   Codeine Itching      Medication List       Accurate as of 09/20/16  1:49 PM. Always use your most recent med list.          ACCU-CHEK  FASTCLIX LANCETS Misc CHECK SUGARS TID   amLODipine 10 MG tablet Commonly known as:  NORVASC TK 1 T PO  QD   BD PEN NEEDLE NANO U/F 32G X 4 MM Misc Generic drug:  Insulin Pen Needle USE TO INJECT INSULIN THREE TIMES DAILY   budesonide-formoterol 160-4.5 MCG/ACT inhaler Commonly known as:  SYMBICORT Inhale 2 puffs into the lungs 2 (two)  times daily. Use with spacer. Rinse, gargle and spit out after use   celecoxib 200 MG capsule Commonly known as:  CELEBREX 1 po BID during menstrual period   clindamycin 1 % lotion Commonly known as:  CLEOCIN T APP AA OF FACE BID   diclofenac 75 MG EC tablet Commonly known as:  VOLTAREN TK 1 T PO BID WF FOR MENSTRUAL CRAMPS ON WORST DAYS OF PERIOD EACH MONTH   estradiol 0.1 MG/GM vaginal cream Commonly known as:  ESTRACE 2 gms/vagina HS twice weekly and use small amount externally every 1-2 days   ferrous sulfate 325 (65 FE) MG tablet TAKE 1 T PO ONCE D.   gabapentin 300 MG capsule Commonly known as:  NEURONTIN TAKE 1 TO 2 CAPSULES BY MOUTH EVERY NIGHT AT BEDTIME   hydrOXYzine 25 MG tablet Commonly known as:  ATARAX/VISTARIL Take 1 tablet (25 mg total) by mouth at bedtime.   ibuprofen 600 MG tablet Commonly known as:  ADVIL,MOTRIN Take 1 tablet (600 mg total) by mouth every 6 (six) hours as needed.   INSULIN SYRINGE .5CC/31GX5/16" 31G X 5/16" 0.5 ML Misc U UTD TO INJ D   ISENTRESS 400 MG tablet Generic drug:  raltegravir TK 1 T PO BID   LANTUS 100 UNIT/ML injection Generic drug:  insulin glargine USE AS DIRECTED, MAXIMUM DAILY DOSE OF 30 UNITS   levofloxacin 500 MG tablet Commonly known as:  LEVAQUIN TK 1 T PO D FOR 10 DAY COURSE   LINZESS 145 MCG Caps capsule Generic drug:  linaclotide   loratadine 10 MG tablet Commonly known as:  CLARITIN Take 1 tablet (10 mg total) by mouth daily.   Magnesium Oxide -Mg Supplement 250 MG Tabs TK 1 T PO D   metFORMIN 500 MG tablet Commonly known as:  GLUCOPHAGE TAKE 2 TABLETS BY MOUTH TWICE DAILY   mometasone 50 MCG/ACT nasal spray Commonly known as:  NASONEX Use 1 Spray Each Nostril Twice a Day   Olopatadine HCl 0.2 % Soln Instill one drop into each eye twice daily   predniSONE 10 MG tablet Commonly known as:  DELTASONE   PROAIR HFA 108 (90 Base) MCG/ACT inhaler Generic drug:  albuterol INL 2 PFS PO Q 4 TO 6  H PRF COUGH OR WHZ   albuterol 108 (90 Base) MCG/ACT inhaler Commonly known as:  VENTOLIN HFA Inhale 2 puffs into the lungs every 4 (four) hours as needed for wheezing or shortness of breath.   promethazine 25 MG tablet Commonly known as:  PHENERGAN Take 25 mg by mouth every 6 (six) hours as needed for nausea or vomiting.   ranitidine 300 MG capsule Commonly known as:  ZANTAC TK 1 C PO HS   rizatriptan 10 MG tablet Commonly known as:  MAXALT Take 10 mg by mouth.   simvastatin 40 MG tablet Commonly known as:  ZOCOR TK 1 T PO D   SINGULAIR 10 MG tablet Generic drug:  montelukast One tablet by mouth once each evening   traMADol 50 MG tablet Commonly known as:  ULTRAM TK 1-2 TS PO Q 6-8 H PRN P  TRUVADA 200-300 MG tablet Generic drug:  emtricitabine-tenofovir TK 1 T PO D   valACYclovir 1000 MG tablet Commonly known as:  VALTREX TK 1 T PO D   VICTOZA 18 MG/3ML Sopn Generic drug:  liraglutide INJECT 1.8 MG UNDER THE SKIN DAILY       Allergies  Allergen Reactions  . Sulfa Antibiotics Hives  . Sulfasalazine Hives  . Ace Inhibitors Other (See Comments)    Makes heart race  . Arnuity Ellipta [Fluticasone Propionate (Inhal)]     100 mcg fatigue  . Lisinopril Hives  . Penicillins Other (See Comments)    Makes her feel bad.  . Codeine Itching   Review of systems: Review of systems negative except as noted in HPI / PMHx or noted below: Constitutional: Negative.  HENT: Negative.   Eyes: Negative.  Respiratory: Negative.   Cardiovascular: Negative.  Gastrointestinal: Negative.  Genitourinary: Negative.  Musculoskeletal: Negative.  Neurological: Negative.  Endo/Heme/Allergies: Negative.  Cutaneous: Negative.  Past Medical History:  Diagnosis Date  . Allergic rhinitis   . Asthma   . Diabetes (HCC)   . Hypercholesteremia   . Migraine     Family History  Problem Relation Age of Onset  . Allergic rhinitis Mother   . Asthma Mother   . Lupus Mother   .  Hypertension Mother   . Allergic rhinitis Sister   . Heart disease Sister   . Angioedema Neg Hx   . Urticaria Neg Hx   . Immunodeficiency Neg Hx   . Eczema Neg Hx     Social History   Social History  . Marital status: Single    Spouse name: N/A  . Number of children: N/A  . Years of education: N/A   Occupational History  . Not on file.   Social History Main Topics  . Smoking status: Never Smoker  . Smokeless tobacco: Never Used  . Alcohol use No  . Drug use: No  . Sexual activity: No   Other Topics Concern  . Not on file   Social History Narrative  . No narrative on file    I appreciate the opportunity to take part in Amber Clark's care. Please do not hesitate to contact me with questions.  Sincerely,   R. Jorene Guestarter Lyna Laningham, MD

## 2016-09-20 NOTE — Patient Instructions (Addendum)
Moderate persistent asthma Currently with suboptimal control.  We will step up therapy at this time.  A prescription has been provided for Symbicort (budesonide/formoterol) 160/4.5 g,  2 inhalations twice a day. To maximize pulmonary deposition, a spacer has been provided along with instructions for its proper administration with an HFA inhaler.  Discontinue Symbicort 80/4.5 g.  Continue albuterol HFA, 1-2 inhalations every 4-6 hours as needed.  The patient has been asked to contact me if her symptoms persist or progress. Otherwise, she may return for follow up in 4 months.  Other allergic rhinitis  Continue appropriate allergen avoidance measures.  I recommended increasing Nasonex from daily to twice a day dosing as needed.  I have also recommended nasal saline spray (i.e., Simply Saline) or nasal saline lavage (i.e., NeilMed) as needed and prior to medicated nasal sprays.   Return in about 4 months (around 01/20/2017), or if symptoms worsen or fail to improve.

## 2016-09-26 ENCOUNTER — Other Ambulatory Visit: Payer: Self-pay

## 2016-09-26 MED ORDER — PROAIR HFA 108 (90 BASE) MCG/ACT IN AERS: 2.0000 | INHALATION_SPRAY | RESPIRATORY_TRACT | 1 refills | Status: DC | PRN

## 2016-11-02 ENCOUNTER — Other Ambulatory Visit: Payer: Self-pay | Admitting: Allergy

## 2016-11-02 MED ORDER — PROAIR HFA 108 (90 BASE) MCG/ACT IN AERS: 2.0000 | INHALATION_SPRAY | RESPIRATORY_TRACT | 1 refills | Status: DC | PRN

## 2016-12-05 ENCOUNTER — Other Ambulatory Visit: Payer: Self-pay

## 2016-12-05 MED ORDER — PROAIR HFA 108 (90 BASE) MCG/ACT IN AERS: 2.0000 | INHALATION_SPRAY | RESPIRATORY_TRACT | 1 refills | Status: DC | PRN

## 2017-01-23 ENCOUNTER — Encounter: Payer: Self-pay | Admitting: Pediatrics

## 2017-01-23 ENCOUNTER — Ambulatory Visit (INDEPENDENT_AMBULATORY_CARE_PROVIDER_SITE_OTHER): Payer: Medicare Other | Admitting: Pediatrics

## 2017-01-23 VITALS — BP 110/66 | HR 86 | Temp 98.6°F | Resp 16 | Ht 60.0 in | Wt 199.0 lb

## 2017-01-23 DIAGNOSIS — B20 Human immunodeficiency virus [HIV] disease: Secondary | ICD-10-CM

## 2017-01-23 DIAGNOSIS — Z794 Long term (current) use of insulin: Secondary | ICD-10-CM | POA: Diagnosis not present

## 2017-01-23 DIAGNOSIS — J4540 Moderate persistent asthma, uncomplicated: Secondary | ICD-10-CM

## 2017-01-23 DIAGNOSIS — E119 Type 2 diabetes mellitus without complications: Secondary | ICD-10-CM | POA: Diagnosis not present

## 2017-01-23 DIAGNOSIS — J3081 Allergic rhinitis due to animal (cat) (dog) hair and dander: Secondary | ICD-10-CM | POA: Diagnosis not present

## 2017-01-23 MED ORDER — MONTELUKAST SODIUM 10 MG PO TABS: ORAL_TABLET | ORAL | 5 refills | Status: DC

## 2017-01-23 MED ORDER — MOMETASONE FUROATE 50 MCG/ACT NA SUSP: NASAL | 5 refills | Status: DC

## 2017-01-23 MED ORDER — BUDESONIDE-FORMOTEROL FUMARATE 80-4.5 MCG/ACT IN AERO: INHALATION_SPRAY | RESPIRATORY_TRACT | 5 refills | Status: DC

## 2017-01-23 NOTE — Patient Instructions (Addendum)
Begin Symbicort 80/4.5, inhale 2 puffs with a spacer every 12 hours to prevent cough or wheeze. Rinse, gargle, and spit out after each use. Stop Symbicort 160/4.5 ProAir, inhale 2 puffs every 4 hours with a spacer as needed for cough or wheeze Montelukast 10 mg daily for cough or wheeze  Continue loratadine 10 mg as needed for runny nose Begin nasal saline wash before nasal steroid (Nasonex) Continue Nasonex- 1 spray each nostril twice a day as needed for stuffy nose  You should get a flu vaccination this year  Continue other medications as outlined in the chart  Call if this treatment plan is not working for you  Follow up in 6 weeks

## 2017-01-23 NOTE — Progress Notes (Deleted)
   [address] [place] [address] Dept: [phone]  FAMILY NURSE PRACTITIONER FOLLOW UP NOTE  Patient ID: Amber Clark, female    DOB: 12/24/1970  Age: 46 y.o. MRN: [id number] Date of Office Visit: 01/23/2017  Assessment  Chief Complaint: Asthma (symbicort causing alot of thick draiange from nose.) and Nasal Congestion (alot of PND)  HPI    Drug Allergies:  Allergies  Allergen Reactions  . Sulfa Antibiotics Hives  . Sulfasalazine Hives  . Ace Inhibitors Other (See Comments)    Makes heart race  . Arnuity Ellipta [Fluticasone Propionate (Inhal)]     100 mcg fatigue  . Lisinopril Hives  . Penicillins Other (See Comments)    Makes her feel bad.  . Codeine Itching    Physical Exam: BP 110/66 (BP Location: Right Arm, Patient Position: Sitting, Cuff Size: Large)   Pulse 86   Temp 98.6 F (37 C) (Oral)   Resp 16   Ht 5' (1.524 m)   Wt 199 lb (90.3 kg)   SpO2 98%   BMI 38.86 kg/m    Physical Exam  Constitutional: She is oriented to person, place, and time. She appears well-developed and well-nourished.  HENT:  Right Ear: External ear normal.  Left Ear: External ear normal.  Nares slightly erythematous and edematous.  Pharynx slightly erythematous.  Ears normal  Eyes: Conjunctivae are normal.  Eyes normal  Neck: Normal range of motion. Neck supple.  Cardiovascular:  S1-S2 normal.  Regular heart rate and rhythm.  Pulmonary/Chest:  Lungs clear to auscultation  Musculoskeletal: Normal range of motion.  Neurological: She is alert and oriented to person, place, and time.  Skin: Skin is warm and dry.  Psychiatric: She has a normal mood and affect. Her behavior is normal. Thought content normal.    Diagnostics: FEV1: 1.51, FVC: 1.84, predicted FEV1: 2.10, predicted FVC: 2.56.  Spirometry shows mild restriction.  Spirometry consistent with last visit.    Assessment and Plan: 1. HIV (human immunodeficiency virus infection) (HCC)   2. Allergic rhinitis due to  animal hair and dander   3. Moderate persistent asthma, uncomplicated     No orders of the defined types were placed in this encounter.   Patient Instructions  Begin Symbicort 80/4.5, inhale 2 puffs with a spacer every 12 hours to prevent cough or wheeze. Rinse, gargle, and spit out after each use. Stop Symbicort 160/4.5 ProAir, inhale 2 puffs every 4 hours with a spacer as needed for cough or wheeze Montelukast 10 mg daily for cough or wheeze  Continue loratadine 10 mg as needed for runny nose Begin nasal saline wash before nasal steroid (Nasonex) Continue Nasonex- 1 spray each nostril twice a day as needed for stuffy nose  You should get a flu vaccination this year  Continue other medications as outlined in the chart  Call if this treatment plan is not working for you  Follow up in 6 weeks   Return in about 6 weeks (around 03/06/2017), or if symptoms worsen or fail to improve.    Thank you for the opportunity to care for this patient.  Please do not hesitate to contact me with questions.  Thermon LeylandAnne Ambs, FNP  [facility]

## 2017-01-23 NOTE — Progress Notes (Signed)
[address] [place] [address] Dept: [phone]  FOLLOW UP NOTE  Patient ID: Amber Clark, female    DOB: 1970/04/22  Age: 46 y.o. MRN: [id number] Date of Office Visit: 01/23/2017  Assessment  Chief Complaint: Asthma (symbicort causing alot of thick draiange from nose.) and Nasal Congestion (alot of PND)  HPI Amber Clark presents for a follow up visit today. She was last seen in this clinic on 09/20/2016 by Dr. Nunzio Cobbs for evaluation of ashtma and allergic rhinitis. At that time she was started on Symbicort 160, Nasonex, and nasal saline rinses.  At today's visit, she reports the Symbicort is working well for her asthma. Yachet's asthma has been well controlled. She has required rescue medication less than 1 time a week, has not experienced nocturnal awakenings due to lower respiratory symptoms, nor have activities of daily living been limited. She has required no Emergency Department or Urgent Care visits for her asthma. She has required zero courses of systemic steroids for asthma exacerbations since the last visit. She reports some chest tightness a few weeks ago. At that time she stopped using all her inhalers for 1 week after which she began using them again and experienced relief from the chest tightness.    She reports nasal congestion on a daily basis for the last 4-5 months. She thinks this congestion is related to Symbicort use. She has been using Nasonex once a day and not using any saline nasal rinses. She also reports a daily headache that can be right sided, left sided, or in the entire back of her head. She reports that nothing makes this headache better or worse.   Current medications include: Symbicort 160, Pro-air, Nasonex, loratadine 10 mg, and Singulair 10 mg. Other medications are outlined in the chart.   Drug Allergies:  Allergies  Allergen Reactions  . Sulfa Antibiotics Hives  . Sulfasalazine Hives  . Ace Inhibitors Other (See Comments)    Makes heart  race  . Arnuity Ellipta [Fluticasone Propionate (Inhal)]     100 mcg fatigue  . Lisinopril Hives  . Penicillins Other (See Comments)    Makes her feel bad.  . Codeine Itching    Physical Exam: BP 110/66 (BP Location: Right Arm, Patient Position: Sitting, Cuff Size: Large)   Pulse 86   Temp 98.6 F (37 C) (Oral)   Resp 16   Ht 5' (1.524 m)   Wt 199 lb (90.3 kg)   SpO2 98%   BMI 38.86 kg/m    Physical Exam  Constitutional: She is oriented to person, place, and time. She appears well-developed and well-nourished.  HENT:  Right Ear: External ear normal.  Left Ear: External ear normal.  Ears normal.  Bilateral nares erythematous and edematous.  Pharynx slightly erythematous.  Eyes: Conjunctivae are normal.  Eyes normal  Neck: Normal range of motion. Neck supple.  Cardiovascular:  S1-S2 normal.  Regular heart rate and rhythm  Pulmonary/Chest:  Lungs clear to auscultation  Musculoskeletal: Normal range of motion.  Neurological: She is alert and oriented to person, place, and time.  Skin: Skin is warm and dry.  Psychiatric: She has a normal mood and affect. Her behavior is normal. Thought content normal.    Diagnostics: FEV1: 1.51, FVC: 1.84.  Predicted FEV1: 2.10, predicted FVC 2.56.  Spirometry indicates mild restriction.  Spirometry consistent with previous visit.    Assessment and Plan: 1. Type 2 diabetes mellitus without complication, with long-term current use of insulin (HCC)   2. HIV (human  immunodeficiency virus infection) (HCC)   3. Allergic rhinitis due to animal hair and dander   4. Moderate persistent asthma, uncomplicated       Patient Instructions  Begin Symbicort 80/4.5, inhale 2 puffs with a spacer every 12 hours to prevent cough or wheeze. Rinse, gargle, and spit out after each use. Stop Symbicort 160/4.5 ProAir, inhale 2 puffs every 4 hours with a spacer as needed for cough or wheeze Montelukast 10 mg daily for cough or wheeze  Continue loratadine  10 mg as needed for runny nose Begin nasal saline wash before nasal steroid (Nasonex) Continue Nasonex- 1 spray each nostril twice a day as needed for stuffy nose  You should get a flu vaccination this year  Continue other medications as outlined in the chart  Call if this treatment plan is not working for you  Follow up in 6 weeks   Return in about 6 weeks (around 03/06/2017), or if symptoms worsen or fail to improve.   Verlon AuLeslie was seen by Dr. Beaulah DinningBardelas in the clinic today.  Thank you for the opportunity to care for this patient.  Please do not hesitate to contact me with questions.  Tonette BihariJ. A. Bardelas, M.D.  [facility] [place] [address] [place], [place] [id number] [phone]

## 2017-07-04 DIAGNOSIS — Z9109 Other allergy status, other than to drugs and biological substances: Secondary | ICD-10-CM | POA: Insufficient documentation

## 2017-07-04 DIAGNOSIS — J300 Vasomotor rhinitis: Secondary | ICD-10-CM | POA: Insufficient documentation

## 2017-07-18 ENCOUNTER — Other Ambulatory Visit: Payer: Self-pay

## 2017-07-18 ENCOUNTER — Telehealth: Payer: Self-pay | Admitting: Allergy

## 2017-07-18 ENCOUNTER — Other Ambulatory Visit: Payer: Self-pay | Admitting: Allergy

## 2017-07-18 MED ORDER — MONTELUKAST SODIUM 10 MG PO TABS: ORAL_TABLET | ORAL | 0 refills | Status: DC

## 2017-07-18 NOTE — Telephone Encounter (Signed)
RF on montelukast given x 1 with no refills as a courtesy. Pt needs an OV

## 2017-07-18 NOTE — Telephone Encounter (Signed)
done

## 2017-07-24 ENCOUNTER — Ambulatory Visit: Payer: Medicare Other | Admitting: Pediatrics

## 2017-08-14 ENCOUNTER — Encounter: Payer: Self-pay | Admitting: Pediatrics

## 2017-08-14 ENCOUNTER — Ambulatory Visit (INDEPENDENT_AMBULATORY_CARE_PROVIDER_SITE_OTHER): Payer: Medicare Other | Admitting: Pediatrics

## 2017-08-14 VITALS — BP 118/60 | HR 84 | Temp 98.7°F | Resp 16 | Ht 60.0 in | Wt 196.0 lb

## 2017-08-14 DIAGNOSIS — J3081 Allergic rhinitis due to animal (cat) (dog) hair and dander: Secondary | ICD-10-CM

## 2017-08-14 DIAGNOSIS — E119 Type 2 diabetes mellitus without complications: Secondary | ICD-10-CM | POA: Diagnosis not present

## 2017-08-14 DIAGNOSIS — Z21 Asymptomatic human immunodeficiency virus [HIV] infection status: Secondary | ICD-10-CM

## 2017-08-14 DIAGNOSIS — B20 Human immunodeficiency virus [HIV] disease: Secondary | ICD-10-CM | POA: Diagnosis not present

## 2017-08-14 DIAGNOSIS — J4540 Moderate persistent asthma, uncomplicated: Secondary | ICD-10-CM | POA: Diagnosis not present

## 2017-08-14 DIAGNOSIS — Z794 Long term (current) use of insulin: Secondary | ICD-10-CM

## 2017-08-14 MED ORDER — MOMETASONE FUROATE 50 MCG/ACT NA SUSP: NASAL | 5 refills | Status: DC

## 2017-08-14 MED ORDER — BUDESONIDE-FORMOTEROL FUMARATE 80-4.5 MCG/ACT IN AERO: INHALATION_SPRAY | RESPIRATORY_TRACT | 5 refills | Status: DC

## 2017-08-14 MED ORDER — PROAIR HFA 108 (90 BASE) MCG/ACT IN AERS: 2.0000 | INHALATION_SPRAY | RESPIRATORY_TRACT | 1 refills | Status: DC | PRN

## 2017-08-14 MED ORDER — CETIRIZINE HCL 10 MG PO TABS: ORAL_TABLET | ORAL | 5 refills | Status: DC

## 2017-08-14 MED ORDER — MONTELUKAST SODIUM 10 MG PO TABS: ORAL_TABLET | ORAL | 0 refills | Status: DC

## 2017-08-14 NOTE — Progress Notes (Signed)
[address] [place] [address] Dept: [phone]  FOLLOW UP NOTE  Patient ID: Amber Clark, female    DOB: 1971-03-16  Age: 47 y.o. MRN: [id number] Date of Office Visit: 08/14/2017  Assessment  Chief Complaint: Asthma (doing well.)  HPI Amber Clark is a 47 year old female who presents to the clinic for a follow up visit. She was last seen in this clinic on 01/23/2017 by Dr Beaulah Dinning for evaluation of asthma and allergic rhinitis. At that time, she was decreased from Symbicort 160 to Symbicort 80- 2 puffs twice a day. She continued montelukast 10 mg once a day.   At today's visit, she reports her asthma has been well controlled. She does report intermittent wheezing and dry cough which is worse with activities. She denies shortness of breath. She is currently using Symbicort 80- 2 puffs twice a day with a spacer, taking montelukast 10 mg once a day, and using the albuterol inhaler 2-4 times a week.   Allergic rhinitis is reported as not well controlled with symptoms including nasal congestion, thick post nasal drip, and runny nose with clear drainage. She is currently using saline nasal spray followed by Nasonex and Atrovent nasal spray once a day. She has been taking Claritin once a day and reports that she can feel it wearing off in the evening. She has tried Xyzal which made her sleepy.   Her current medications are listed in the chart.   Drug Allergies:  Allergies  Allergen Reactions  . Sulfa Antibiotics Hives  . Sulfasalazine Hives  . Ace Inhibitors Other (See Comments)    Makes heart race  . Arnuity Ellipta [Fluticasone Propionate (Inhal)]     100 mcg fatigue  . Lisinopril Hives  . Other Other (See Comments)    100 mcg fatigue  . Penicillins Other (See Comments)    Makes her feel bad.  . Codeine Itching    Physical Exam: BP 118/60 (BP Location: Right Arm, Patient Position: Sitting, Cuff Size: Normal)   Pulse 84   Temp 98.7 F (37.1 C) (Oral)   Resp 16   Ht  5' (1.524 m)   Wt 196 lb (88.9 kg)   SpO2 97%   BMI 38.28 kg/m    Physical Exam  Constitutional: She is oriented to person, place, and time. She appears well-developed and well-nourished.  HENT:  Head: Normocephalic.  Right Ear: External ear normal.  Left Ear: External ear normal.  Mouth/Throat: Oropharynx is clear and moist.  Bilateral nares slightly erythematous and edematous with clear nasal drainage noted. Pharynx normal. Ears normal. Eyes normal.  Eyes: Conjunctivae are normal.  Neck: Normal range of motion. Neck supple.  Cardiovascular: Normal rate, regular rhythm and normal heart sounds.  No murmur noted.  Pulmonary/Chest: Effort normal and breath sounds normal.  Lungs clear to auscultation  Musculoskeletal: Normal range of motion.  Neurological: She is alert and oriented to person, place, and time.  Skin: Skin is warm and dry.  Psychiatric: She has a normal mood and affect. Her behavior is normal. Judgment and thought content normal.    Diagnostics: FVC 1.97, FEV1 1.80. Predicted FVC 2.56, predicted FEV1 2.10. Spirometry indicates mild restriction. Spirometry is consistent with previous visits.    Assessment and Plan: 1. Moderate persistent asthma without complication   2. Allergic rhinitis due to animal hair and dander   3. HIV infection, unspecified symptom status (HCC)   4. Type 2 diabetes mellitus without complication, with long-term current use of insulin (HCC)  Meds ordered this encounter  Medications  . cetirizine (ZYRTEC) 10 MG tablet    Sig: One tablet once a day if needed for runny nose or itching.    Dispense:  30 tablet    Refill:  5  . PROAIR HFA 108 (90 Base) MCG/ACT inhaler    Sig: Inhale 2 puffs into the lungs every 4 (four) hours as needed for wheezing or shortness of breath.    Dispense:  1 Inhaler    Refill:  1  . budesonide-formoterol (SYMBICORT) 80-4.5 MCG/ACT inhaler    Sig: 2 puffs twice daily to prevent coughing or wheezing.     Dispense:  1 Inhaler    Refill:  5  . mometasone (NASONEX) 50 MCG/ACT nasal spray    Sig: 1 spray per nostril twice a day as needed for stuffy nose.    Dispense:  17 g    Refill:  5  . montelukast (SINGULAIR) 10 MG tablet    Sig: Take-1 tablet once at night for coughing or wheezing.    Dispense:  90 tablet    Refill:  0    Courtesy refill one time only    Patient Instructions  Continue 80/4.5, inhale 2 puffs with a spacer every 12 hours to prevent cough or wheeze. Rinse, gargle, and spit out after each use. ProAir, inhale 2 puffs every 4 hours with a spacer as needed for cough or wheeze Montelukast 10 mg daily to prevent cough or wheeze  Begin cetirizine (Zyrtec) 10 mg once a day as needed for runny nose Continue nasal saline wash before nasal steroid (Nasonex) Continue Nasonex- 1 spray each nostril twice a day as needed for stuffy nose  Continue other medications as outlined in the chart  Call if this treatment plan is not working for you  Follow up in 6 months or sooner if needed   Return in about 6 months (around 02/14/2018), or if symptoms worsen or fail to improve.   Thank you for the opportunity to care for this patient.  Please do not hesitate to contact me with questions.  Thermon Leyland, FNP [facility]  I have provided oversight concerning Thermon Leyland' evaluation and treatment of this patient's health issues addressed during today's encounter. I agree with the assessment and therapeutic plan as outlined in the note.   Thank you for the opportunity to care for this patient.  Please do not hesitate to contact me with questions.  Tonette Bihari, M.D.  [facility] [address] [place], [place] [address] [phone]

## 2017-08-14 NOTE — Patient Instructions (Addendum)
Continue 80/4.5, inhale 2 puffs with a spacer every 12 hours to prevent cough or wheeze. Rinse, gargle, and spit out after each use. ProAir, inhale 2 puffs every 4 hours with a spacer as needed for cough or wheeze Montelukast 10 mg daily to prevent cough or wheeze  Begin cetirizine (Zyrtec) 10 mg once a day as needed for runny nose Continue nasal saline wash before nasal steroid (Nasonex) Continue Nasonex- 1 spray each nostril twice a day as needed for stuffy nose  Continue other medications as outlined in the chart  Call if this treatment plan is not working for you  Follow up in 6 months or sooner if needed

## 2017-08-17 ENCOUNTER — Other Ambulatory Visit: Payer: Self-pay

## 2017-08-17 MED ORDER — ALBUTEROL SULFATE HFA 108 (90 BASE) MCG/ACT IN AERS: 2.0000 | INHALATION_SPRAY | Freq: Four times a day (QID) | RESPIRATORY_TRACT | 1 refills | Status: DC | PRN

## 2017-08-28 ENCOUNTER — Telehealth: Payer: Self-pay | Admitting: Allergy

## 2017-08-28 NOTE — Telephone Encounter (Signed)
error 

## 2017-09-18 ENCOUNTER — Other Ambulatory Visit: Payer: Self-pay | Admitting: Allergy

## 2017-09-19 ENCOUNTER — Other Ambulatory Visit: Payer: Self-pay | Admitting: Allergy

## 2017-09-19 ENCOUNTER — Telehealth: Payer: Self-pay | Admitting: Allergy

## 2017-09-19 NOTE — Telephone Encounter (Signed)
Symbicort 80  Does not need to be approved per [employer]

## 2017-09-19 NOTE — Telephone Encounter (Signed)
done

## 2017-10-16 ENCOUNTER — Other Ambulatory Visit: Payer: Self-pay | Admitting: Allergy

## 2017-10-16 ENCOUNTER — Telehealth: Payer: Self-pay | Admitting: Allergy

## 2017-10-16 MED ORDER — OLOPATADINE HCL 0.2 % OP SOLN: OPHTHALMIC | 5 refills | Status: DC

## 2017-10-16 NOTE — Telephone Encounter (Signed)
Symbicort does not need to be approved.

## 2017-10-16 NOTE — Telephone Encounter (Signed)
10-16-2017. No PA required on Symbicort.

## 2017-10-16 NOTE — Telephone Encounter (Signed)
na

## 2018-02-06 ENCOUNTER — Other Ambulatory Visit: Payer: Self-pay | Admitting: Family Medicine

## 2018-02-06 DIAGNOSIS — M5412 Radiculopathy, cervical region: Secondary | ICD-10-CM | POA: Insufficient documentation

## 2018-02-06 DIAGNOSIS — M47812 Spondylosis without myelopathy or radiculopathy, cervical region: Secondary | ICD-10-CM | POA: Insufficient documentation

## 2018-02-06 DIAGNOSIS — M5030 Other cervical disc degeneration, unspecified cervical region: Secondary | ICD-10-CM | POA: Insufficient documentation

## 2018-02-13 ENCOUNTER — Ambulatory Visit (INDEPENDENT_AMBULATORY_CARE_PROVIDER_SITE_OTHER): Payer: Medicare Other | Admitting: Family Medicine

## 2018-02-13 ENCOUNTER — Encounter: Payer: Self-pay | Admitting: Family Medicine

## 2018-02-13 VITALS — BP 120/68 | HR 84 | Temp 97.8°F | Resp 18 | Ht 60.0 in | Wt 198.4 lb

## 2018-02-13 DIAGNOSIS — B20 Human immunodeficiency virus [HIV] disease: Secondary | ICD-10-CM | POA: Diagnosis not present

## 2018-02-13 DIAGNOSIS — J309 Allergic rhinitis, unspecified: Secondary | ICD-10-CM

## 2018-02-13 DIAGNOSIS — Z794 Long term (current) use of insulin: Secondary | ICD-10-CM

## 2018-02-13 DIAGNOSIS — J4540 Moderate persistent asthma, uncomplicated: Secondary | ICD-10-CM

## 2018-02-13 DIAGNOSIS — E119 Type 2 diabetes mellitus without complications: Secondary | ICD-10-CM

## 2018-02-13 MED ORDER — CETIRIZINE HCL 10 MG PO TABS: 10.0000 mg | ORAL_TABLET | Freq: Every day | ORAL | 3 refills | Status: DC

## 2018-02-13 MED ORDER — PROAIR HFA 108 (90 BASE) MCG/ACT IN AERS: 2.0000 | INHALATION_SPRAY | RESPIRATORY_TRACT | 1 refills | Status: DC | PRN

## 2018-02-13 MED ORDER — BUDESONIDE-FORMOTEROL FUMARATE 80-4.5 MCG/ACT IN AERO: INHALATION_SPRAY | RESPIRATORY_TRACT | 5 refills | Status: DC

## 2018-02-13 NOTE — Patient Instructions (Addendum)
Continue Symbicort 80/4.5, inhale 2 puffs with a spacer every 12 hours to prevent cough or wheeze. Rinse, gargle, and spit out after each use. ProAir inhale 2 puffs every 4 hours with a spacer as needed for cough or wheeze. Use this medication as needed instead of part of a routine Continue Montelukast 10 mg daily to prevent cough or wheeze  Continue cetirizine (Zyrtec) 10 mg once a day as needed for runny nose Continue nasal saline rinses before nasal steroid (Nasonex) Continue Nasonex- 1 spray each nostril twice a day as needed for stuffy nose. Right nostril point toward right ear. Left nostril point toward left ear Consider nasal saline gel once a day for dry nose  Continue other medications as outlined in the chart  Call if this treatment plan is not working for you  Follow up in 6 months or sooner if needed

## 2018-02-13 NOTE — Progress Notes (Signed)
[address] [place] [id number] Dept: [phone]  FOLLOW UP NOTE  Patient ID: Amber Clark, female    DOB: 04/27/1970  Age: 47 y.o. MRN: [id number] Date of Office Visit: 02/13/2018  Assessment  Chief Complaint: Allergic Rhinitis  (runny nose and nasal congestion.) and Asthma  HPI Amber GlassingLeslie Meaney is a 47 year old female who presents to the clinic for a follow up visit.  She reports her asthma has been moderately well controlled with intermittent shortness of breath, wheeze, and dry cough and with productive cough with clear mucus.  She continues Symbicort 80-2 puffs twice a day with a spacer, montelukast once a day, and albuterol once a day in the morning as part of her daily routine.  Allergic rhinitis is reported as moderately well controlled with her main symptom as nasal congestion.  She takes cetirizine once a day and uses both Nasonex and Atrovent as needed.  She occasionally uses saline nasal rinses.  Her current medications are listed in the chart.  HIV is well controlled   Drug Allergies:  Allergies  Allergen Reactions  . Sulfa Antibiotics Hives  . Sulfasalazine Hives  . Ace Inhibitors Other (See Comments)    Makes heart race  . Arnuity Ellipta [Fluticasone Propionate (Inhal)]     100 mcg fatigue  . Lisinopril Hives  . Other Other (See Comments)    100 mcg fatigue  . Penicillins Other (See Comments)    Makes her feel bad.  . Codeine Itching    Physical Exam: BP 120/68 (BP Location: Left Arm, Patient Position: Sitting, Cuff Size: Normal)   Pulse 84   Temp 97.8 F (36.6 C) (Oral)   Resp 18   Ht 5' (1.524 m)   Wt 198 lb 6.6 oz (90 kg)   SpO2 97%   BMI 38.75 kg/m    Physical Exam  Constitutional: She is oriented to person, place, and time. She appears well-developed and well-nourished.  HENT:  Head: Normocephalic.  Right Ear: External ear normal.  Left Ear: External ear normal.  Bilateral nares erythematous and edematous with clear nasal drainage noted.   Pharynx slightly erythematous with no exudate noted.  Ears normal.  Eyes normal.  Eyes: Conjunctivae are normal.  Neck: Normal range of motion. Neck supple.  Cardiovascular: Normal rate, regular rhythm and normal heart sounds.  No murmur noted  Pulmonary/Chest: Effort normal and breath sounds normal.  Lungs clear to auscultation  Musculoskeletal: Normal range of motion.  Neurological: She is alert and oriented to person, place, and time.  Skin: Skin is warm and dry.  Psychiatric: She has a normal mood and affect. Her behavior is normal. Judgment and thought content normal.  Vitals reviewed.   Diagnostics: FVC 1.99, FEV1 1.76.  Predicted FVC 2.54, predicted FEV1 2.08.  Spirometry indicates mild restriction.  This is consistent with previous spirometry readings.  Assessment and Plan: 1. Moderate persistent asthma without complication   2. Allergic rhinitis, unspecified seasonality, unspecified trigger   3. HIV infection, unspecified symptom status (HCC)   4. Type 2 diabetes mellitus without complication, with long-term current use of insulin (HCC)     Meds ordered this encounter  Medications  . cetirizine (ZYRTEC) 10 MG tablet    Sig: Take 1 tablet (10 mg total) by mouth daily.    Dispense:  90 tablet    Refill:  3  . budesonide-formoterol (SYMBICORT) 80-4.5 MCG/ACT inhaler    Sig: 2 puffs twice daily to prevent coughing or wheezing. Rinse, gargle and spit  after use.    Dispense:  10.2 g    Refill:  5  . PROAIR HFA 108 (90 Base) MCG/ACT inhaler    Sig: Inhale 2 puffs into the lungs every 4 (four) hours as needed for wheezing or shortness of breath.    Dispense:  18 g    Refill:  1    Patient Instructions  Continue Symbicort 80/4.5, inhale 2 puffs with a spacer every 12 hours to prevent cough or wheeze. Rinse, gargle, and spit out after each use. ProAir inhale 2 puffs every 4 hours with a spacer as needed for cough or wheeze. Use this medication as needed instead of part of a  routine Continue Montelukast 10 mg daily to prevent cough or wheeze  Continue cetirizine (Zyrtec) 10 mg once a day as needed for runny nose Continue nasal saline rinses before nasal steroid (Nasonex) Continue Nasonex- 1 spray each nostril twice a day as needed for stuffy nose. Right nostril point toward right ear. Left nostril point toward left ear Consider nasal saline gel once a day for dry nose  Continue other medications as outlined in the chart  Call if this treatment plan is not working for you  Follow up in 6 months or sooner if needed   Return in about 6 months (around 08/14/2018), or if symptoms worsen or fail to improve.   Thank you for the opportunity to care for this patient.  Please do not hesitate to contact me with questions.  Thermon Leyland, FNP [facility]  I have provided oversight concerning Thermon Leyland' evaluation and treatment of this patient's health issues addressed during today's encounter. I agree with the assessment and therapeutic plan as outlined in the note.   Thank you for the opportunity to care for this patient.  Please do not hesitate to contact me with questions.  Tonette Bihari, M.D.  [facility] [address] [place], [place] [address] [phone]

## 2018-03-15 ENCOUNTER — Other Ambulatory Visit: Payer: Self-pay | Admitting: Family Medicine

## 2018-08-14 ENCOUNTER — Other Ambulatory Visit: Payer: Self-pay

## 2018-08-14 ENCOUNTER — Ambulatory Visit (INDEPENDENT_AMBULATORY_CARE_PROVIDER_SITE_OTHER): Payer: Medicare Other | Admitting: Family Medicine

## 2018-08-14 ENCOUNTER — Encounter: Payer: Self-pay | Admitting: Family Medicine

## 2018-08-14 VITALS — BP 120/74 | HR 96 | Temp 98.9°F | Resp 24 | Ht 60.24 in | Wt 194.0 lb

## 2018-08-14 DIAGNOSIS — J4540 Moderate persistent asthma, uncomplicated: Secondary | ICD-10-CM | POA: Diagnosis not present

## 2018-08-14 DIAGNOSIS — J309 Allergic rhinitis, unspecified: Secondary | ICD-10-CM | POA: Diagnosis not present

## 2018-08-14 DIAGNOSIS — Z794 Long term (current) use of insulin: Secondary | ICD-10-CM

## 2018-08-14 DIAGNOSIS — J0100 Acute maxillary sinusitis, unspecified: Secondary | ICD-10-CM | POA: Diagnosis not present

## 2018-08-14 DIAGNOSIS — E119 Type 2 diabetes mellitus without complications: Secondary | ICD-10-CM

## 2018-08-14 DIAGNOSIS — Z21 Asymptomatic human immunodeficiency virus [HIV] infection status: Secondary | ICD-10-CM

## 2018-08-14 MED ORDER — CEFDINIR 300 MG PO CAPS: ORAL_CAPSULE | ORAL | 0 refills | Status: DC

## 2018-08-14 NOTE — Progress Notes (Signed)
[address] [place] [id number] Dept: [phone]  FOLLOW UP NOTE  Patient ID: Amber GlassingLeslie Darr, female    DOB: 06/11/1970  Age: 48 y.o. MRN: [id number] Date of Office Visit: 08/14/2018  Assessment  Chief Complaint: Allergic Rhinitis ; Asthma; Cough; and Nasal Congestion  HPI Amber Clark is a 48 year old female who presents to the clinic for a follow up visit. She reports that for the last week she has been experiencing nasal congestion, facial pressure, green nasal drainage, body chills, and bilateral ear ache for which she received Levaquin and a steroid injection from her PCP last week which she reports did not relieve her symptoms. At today's visit, she continues to experience the symptoms in addition to occasional shortness of breath for 1 week and green nasal drainage. She is afebrile and denies sick contacts. She continues Symbicort 80-2 puffs twice a day with a spacer, montelukast 10 mg once a day and uses her albuterol twice a day with her Symbicort "as a preventative medication". She reports nasal congestion and dry nostrils for which she is using Nasonex, cetirizine, and saline nasal rinses daily. Her HIV is well controlled at this time. Her current medications are listed in the chart.   Drug Allergies:  Allergies  Allergen Reactions  . Sulfa Antibiotics Hives  . Sulfasalazine Hives  . Ace Inhibitors Other (See Comments)    Makes heart race  . Arnuity Ellipta [Fluticasone Propionate (Inhal)]     100 mcg fatigue  . Lisinopril Hives  . Other Other (See Comments)    100 mcg fatigue  . Penicillins Other (See Comments)    Makes her feel bad.  . Codeine Itching    Physical Exam: BP 120/74 (BP Location: Left Arm, Patient Position: Sitting, Cuff Size: Normal)   Pulse 96   Temp 98.9 F (37.2 C) (Oral)   Resp (!) 24   Ht 5' 0.24" (1.53 m)   Wt 194 lb 0.1 oz (88 kg)   SpO2 97%   BMI 37.59 kg/m    Physical Exam Vitals signs reviewed.  Constitutional:    Appearance: Normal appearance.  HENT:     Head: Normocephalic and atraumatic.     Right Ear: Tympanic membrane normal.     Left Ear: Tympanic membrane normal.     Nose:     Comments: Bilateral nares erythematous with clear nasal drainage noted. Pharynx normal. Ears normal. Eyes normal.    Mouth/Throat:     Pharynx: Oropharynx is clear.  Eyes:     Conjunctiva/sclera: Conjunctivae normal.  Neck:     Musculoskeletal: Normal range of motion and neck supple.  Cardiovascular:     Rate and Rhythm: Normal rate and regular rhythm.     Heart sounds: Normal heart sounds. No murmur.  Pulmonary:     Effort: Pulmonary effort is normal.     Breath sounds: Normal breath sounds.     Comments: Lungs clear to auscultation Musculoskeletal: Normal range of motion.  Skin:    General: Skin is warm and dry.  Neurological:     Mental Status: She is alert and oriented to person, place, and time.  Psychiatric:        Mood and Affect: Mood normal.        Behavior: Behavior normal.        Thought Content: Thought content normal.        Judgment: Judgment normal.     Diagnostics: FVC 1.76, FEV1 1.53. Predicted FVC 2.54, predicted FEV1 2.08. Spirometry  indicates mild restriction. This is consistent with previous spirometry readings.   Assessment and Plan: 1. Moderate persistent asthma without complication   2. Acute non-recurrent maxillary sinusitis   3. Allergic rhinitis, unspecified seasonality, unspecified trigger   4. Asymptomatic HIV infection (HCC)   5. Type 2 diabetes mellitus without complication, with long-term current use of insulin (HCC)     Meds ordered this encounter  Medications  . cefdinir (OMNICEF) 300 MG capsule    Sig: Take 1 capsule every 12 hours for 10 days. For infection.    Dispense:  20 capsule    Refill:  0    Patient Instructions  Acute sinusitis Begin cefdinir 300 mg capsules. Take 1 capsule every 12 hours for 10 days Continue saline nasal rinses   Asthma  Continue Symbicort 80/4.5, inhale 2 puffs with a spacer every 12 hours to prevent cough or wheeze. Rinse, gargle, and spit out after each use. Continue Montelukast 10 mg daily to prevent cough or wheeze  ProAir inhale 2 puffs every 4 hours with a spacer as needed for cough or wheeze. Use this medication as needed instead of part of a routine  Allergic rhinitis Stop cetirizine and start Xyzal 5 mg once a day as needed for runny nose Continue nasal saline rinses before nasal steroid (Nasonex) Continue Nasonex- 1 spray each nostril twice a day as needed for stuffy nose. Right nostril point toward right ear. Left nostril point toward left ear Consider nasal saline gel once a day for dry nose  Continue other medications as outlined in the chart  Call if this treatment plan is not working for you  Follow up in 6 months or sooner if needed   Return in about 6 months (around 02/14/2019), or if symptoms worsen or fail to improve.   Thank you for the opportunity to care for this patient.  Please do not hesitate to contact me with questions.  Thermon Leyland, FNP [facility]  I have provided oversight concerning Thermon Leyland' evaluation and treatment of this patient's health issues addressed during today's encounter. I agree with the assessment and therapeutic plan as outlined in the note.   Thank you for the opportunity to care for this patient.  Please do not hesitate to contact me with questions.  Tonette Bihari, M.D.  [facility] [address] [place], [place] [address] [phone]

## 2018-08-14 NOTE — Patient Instructions (Addendum)
Acute sinusitis Begin cefdinir 300 mg capsules. Take 1 capsule every 12 hours for 10 days Continue saline nasal rinses   Asthma Continue Symbicort 80/4.5, inhale 2 puffs with a spacer every 12 hours to prevent cough or wheeze. Rinse, gargle, and spit out after each use. Continue Montelukast 10 mg daily to prevent cough or wheeze  ProAir inhale 2 puffs every 4 hours with a spacer as needed for cough or wheeze. Use this medication as needed instead of part of a routine  Allergic rhinitis Stop cetirizine and start Xyzal 5 mg once a day as needed for runny nose Continue nasal saline rinses before nasal steroid (Nasonex) Continue Nasonex- 1 spray each nostril twice a day as needed for stuffy nose. Right nostril point toward right ear. Left nostril point toward left ear Consider nasal saline gel once a day for dry nose  Continue other medications as outlined in the chart  Call if this treatment plan is not working for you  Follow up in 6 months or sooner if needed

## 2018-08-17 ENCOUNTER — Telehealth: Payer: Self-pay | Admitting: *Deleted

## 2018-08-17 ENCOUNTER — Other Ambulatory Visit: Payer: Medicare Other

## 2018-08-17 ENCOUNTER — Other Ambulatory Visit: Payer: Self-pay

## 2018-08-17 ENCOUNTER — Encounter: Payer: Self-pay | Admitting: Allergy

## 2018-08-17 ENCOUNTER — Ambulatory Visit (INDEPENDENT_AMBULATORY_CARE_PROVIDER_SITE_OTHER): Payer: Medicare Other | Admitting: Allergy

## 2018-08-17 DIAGNOSIS — R509 Fever, unspecified: Secondary | ICD-10-CM | POA: Diagnosis not present

## 2018-08-17 DIAGNOSIS — J3089 Other allergic rhinitis: Secondary | ICD-10-CM | POA: Diagnosis not present

## 2018-08-17 DIAGNOSIS — J0100 Acute maxillary sinusitis, unspecified: Secondary | ICD-10-CM | POA: Diagnosis not present

## 2018-08-17 DIAGNOSIS — Z20822 Contact with and (suspected) exposure to covid-19: Secondary | ICD-10-CM

## 2018-08-17 DIAGNOSIS — J4540 Moderate persistent asthma, uncomplicated: Secondary | ICD-10-CM

## 2018-08-17 MED ORDER — AZITHROMYCIN 250 MG PO TABS: ORAL_TABLET | ORAL | 0 refills | Status: DC

## 2018-08-17 NOTE — Assessment & Plan Note (Signed)
   Continue Xyzal 5 mg once a day.  Continue nasal saline rinses before nasal steroid (Nasonex)  Continue Nasonex- 1 spray each nostril twice a day as needed for stuffy nose. Right nostril point toward right ear. Left nostril point toward left ear

## 2018-08-17 NOTE — Assessment & Plan Note (Signed)
Not improving with cefdinir.   Stop Cefdinir  Start Z-pak. 2 tablets on day 1, then 1 tablet days 2-5.   Continue with saline wash twice a day and nasonex 1 spray twice a day.

## 2018-08-17 NOTE — Telephone Encounter (Signed)
Wyline Mood, DO Allergy and Asthma [phone]  Symptomatic with multiple symptoms requires testing

## 2018-08-17 NOTE — Assessment & Plan Note (Signed)
Stable. Daily controller medication(s): continue Symbicort 80 2 puffs twice a day with spacer and rinse mouth afterwards and continue Singulair 10mg  daily.  Prior to physical activity: May use albuterol rescue inhaler 2 puffs 5 to 15 minutes prior to strenuous physical activities. Rescue medications: May use albuterol rescue inhaler 2 puffs or nebulizer every 4 to 6 hours as needed for shortness of breath, chest tightness, coughing, and wheezing. Monitor frequency of use.

## 2018-08-17 NOTE — Patient Instructions (Addendum)
Fever  Will send you to get COVID-19 testing. The testing site will call you to schedule appointment.  Please self-quarantine yourself until results are back.   Take Tylenol for fever control and NOT ibuprofen.   If you are feeling worse over the weekend please go to the ER for further evaluation. If you do have to go out please wear a mask. See below for guidelines.   Acute sinusitis  Stop Cefdinir  Start Z-pak. 2 tablets on day 1, then 1 tablet days 2-5.   Asthma Daily controller medication(s): continue Symbicort 80 2 puffs twice a day with spacer and rinse mouth afterwards and continue Singulair  daily.  Prior to physical activity: May use albuterol rescue inhaler 2 puffs 5 to 15 minutes prior to strenuous physical activities. Rescue medications: May use albuterol rescue inhaler 2 puffs or nebulizer every 4 to 6 hours as needed for shortness of breath, chest tightness, coughing, and wheezing. Monitor frequency of use.  Asthma control goals:  Full participation in all desired activities (may need albuterol before activity) Albuterol use two times or less a week on average (not counting use with activity) Cough interfering with sleep two times or less a month Oral steroids no more than once a year No hospitalizations  Allergic rhinitis  Continue Xyzal 5 mg once a day.  Continue nasal saline rinses before nasal steroid (Nasonex)  Continue Nasonex- 1 spray each nostril twice a day as needed for stuffy nose. Right nostril point toward right ear. Left nostril point toward left ear  Follow up in 6 months as scheduled.    Infection Prevention Recommendations for Individuals Confirmed to have, or Being Evaluated for, 2019 Novel Coronavirus (COVID-19) Infection Who Receive Care at Home  Individuals who are confirmed to have, or are being evaluated for, COVID-19 should follow the prevention steps below until a healthcare provider or local or state health department says they  can return to normal activities.  Stay home except to get medical care You should restrict activities outside your home, except for getting medical care. Do not go to work, school, or public areas, and do not use public transportation or taxis.  Call ahead before visiting your doctor Before your medical appointment, call the healthcare provider and tell them that you have, or are being evaluated for, COVID-19 infection. This will help the healthcare provider's office take steps to keep other people from getting infected. Ask your healthcare provider to call the local or state health department.  Monitor your symptoms Seek prompt medical attention if your illness is worsening (e.g., difficulty breathing). Before going to your medical appointment, call the healthcare provider and tell them that you have, or are being evaluated for, COVID-19 infection. Ask your healthcare provider to call the local or state health department.  Wear a facemask You should wear a facemask that covers your nose and mouth when you are in the same room with other people and when you visit a healthcare provider. People who live with or visit you should also wear a facemask while they are in the same room with you.  Separate yourself from other people in your home As much as possible, you should stay in a different room from other people in your home. Also, you should use a separate bathroom, if available.  Avoid sharing household items You should not share dishes, drinking glasses, cups, eating utensils, towels, bedding, or other items with other people in your home. After using these items, you should wash them  thoroughly with soap and water.  Cover your coughs and sneezes Cover your mouth and nose with a tissue when you cough or sneeze, or you can cough or sneeze into your sleeve. Throw used tissues in a lined trash can, and immediately wash your hands with soap and water for at least 20 seconds or use  an alcohol-based hand rub.  Wash your [employer] your hands often and thoroughly with soap and water for at least 20 seconds. You can use an alcohol-based hand sanitizer if soap and water are not available and if your hands are not visibly dirty. Avoid touching your eyes, nose, and mouth with unwashed hands.   Prevention Steps for Caregivers and Household Members of Individuals Confirmed to have, or Being Evaluated for, COVID-19 Infection Being Cared for in the Home  If you live with, or provide care at home for, a person confirmed to have, or being evaluated for, COVID-19 infection please follow these guidelines to prevent infection:  Follow healthcare provider's instructions Make sure that you understand and can help the patient follow any healthcare provider instructions for all care.  Provide for the patient's basic needs You should help the patient with basic needs in the home and provide support for getting groceries, prescriptions, and other personal needs.  Monitor the patient's symptoms If they are getting sicker, call his or her medical provider and tell them that the patient has, or is being evaluated for, COVID-19 infection. This will help the healthcare provider's office take steps to keep other people from getting infected. Ask the healthcare provider to call the local or state health department.  Limit the number of people who have contact with the patient  If possible, have only one caregiver for the patient.  Other household members should stay in another home or place of residence. If this is not possible, they should stay  in another room, or be separated from the patient as much as possible. Use a separate bathroom, if available.  Restrict visitors who do not have an essential need to be in the home.  Keep older adults, very young children, and other sick people away from the patient Keep older adults, very young children, and those who have compromised  immune systems or chronic health conditions away from the patient. This includes people with chronic heart, lung, or kidney conditions, diabetes, and cancer.  Ensure good ventilation Make sure that shared spaces in the home have good air flow, such as from an air conditioner or an opened window, weather permitting.  Wash your hands often  Wash your hands often and thoroughly with soap and water for at least 20 seconds. You can use an alcohol based hand sanitizer if soap and water are not available and if your hands are not visibly dirty.  Avoid touching your eyes, nose, and mouth with unwashed hands.  Use disposable paper towels to dry your hands. If not available, use dedicated cloth towels and replace them when they become wet.  Wear a facemask and gloves  Wear a disposable facemask at all times in the room and gloves when you touch or have contact with the patient's blood, body fluids, and/or secretions or excretions, such as sweat, saliva, sputum, nasal mucus, vomit, urine, or feces.  Ensure the mask fits over your nose and mouth tightly, and do not touch it during use.  Throw out disposable facemasks and gloves after using them. Do not reuse.  Wash your hands immediately after removing your facemask and gloves.  If your personal clothing becomes contaminated, carefully remove clothing and launder. Wash your hands after handling contaminated clothing.  Place all used disposable facemasks, gloves, and other waste in a lined container before disposing them with other household waste.  Remove gloves and wash your hands immediately after handling these items.  Do not share dishes, glasses, or other household items with the patient  Avoid sharing household items. You should not share dishes, drinking glasses, cups, eating utensils, towels, bedding, or other items with a patient who is confirmed to have, or being evaluated for, COVID-19 infection.  After the person uses these items, you  should wash them thoroughly with soap and water.  Wash laundry thoroughly  Immediately remove and wash clothes or bedding that have blood, body fluids, and/or secretions or excretions, such as sweat, saliva, sputum, nasal mucus, vomit, urine, or feces, on them.  Wear gloves when handling laundry from the patient.  Read and follow directions on labels of laundry or clothing items and detergent. In general, wash and dry with the warmest temperatures recommended on the label.  Clean all areas the individual has used often  Clean all touchable surfaces, such as counters, tabletops, doorknobs, bathroom fixtures, toilets, phones, keyboards, tablets, and bedside tables, every day. Also, clean any surfaces that may have blood, body fluids, and/or secretions or excretions on them.  Wear gloves when cleaning surfaces the patient has come in contact with.  Use a diluted bleach solution (e.g., dilute bleach with 1 part bleach and 10 parts water) or a household disinfectant with a label that says EPA-registered for coronaviruses. To make a bleach solution at home, add 1 tablespoon of bleach to 1 quart (4 cups) of water. For a larger supply, add  cup of bleach to 1 gallon (16 cups) of water.  Read labels of cleaning products and follow recommendations provided on product labels. Labels contain instructions for safe and effective use of the cleaning product including precautions you should take when applying the product, such as wearing gloves or eye protection and making sure you have good ventilation during use of the product.  Remove gloves and wash hands immediately after cleaning.  Monitor yourself for signs and symptoms of illness Caregivers and household members are considered close contacts, should monitor their health, and will be asked to limit movement outside of the home to the extent possible. Follow the monitoring steps for close contacts listed on the symptom monitoring form.   ? If you  have additional questions, contact your local health department or call the epidemiologist on call at [phone] (available 24/7). ? This guidance is subject to change. For the most up-to-date guidance from [facility], please refer to their website: TripMetro.huhttps://www.cdc.gov/coronavirus/2019-ncov/hcp/guidance-prevent-spread.html

## 2018-08-17 NOTE — Progress Notes (Signed)
RE: Marketia Tenbroeck MRN: [id number] DOB: 26-Jan-1971 Date of Telemedicine Visit: 08/17/2018  Referring provider: Caffie Damme, MD Primary care provider: Caffie Damme, MD  Chief Complaint: Asthma   Telemedicine Follow Up Visit via Telephone: I connected with Ubaldo Glassing for a follow up on 08/17/18 by telephone and verified that I am speaking with the correct person using two identifiers.   I discussed the limitations, risks, security and privacy concerns of performing an evaluation and management service by telephone and the availability of in person appointments. I also discussed with the patient that there may be a patient responsible charge related to this service. The patient expressed understanding and agreed to proceed.  Patient is at home.  Provider is at the office.  Visit start time: 1:30PM Visit end time: 2:00PM Insurance consent/check in by: Wyn Forster B. Medical consent and medical assistant/nurse: Reece Levy.  History of Present Illness: She is a 48 y.o. female, who is being followed for asthma, allergic rhinitis, sinusitis. Her previous allergy office visit was on 08/14/2018 with Thermon Leyland, FNP. Today is a new complaint visit of worsening symptoms with fevers.  Fever/asthma/allergies: Patient was seen in our office on 5/19 and after she started to take cefdinir started to develop fevers. Tmax was about 100.6 and has been taking ibuprofen with good benefit on a daily basis. Denies any contacts with URI symptoms or anyone who was diagnosed with COVID-19. Patient lives with her mother who has no symptoms.   Other symptoms include headaches, coughing, congestion in the chest, shortness of breath and pain with breathing, body aches.  Other pertinent medical history includes HIV and diabetes.   Patient has history of multiple antibiotic allergies. She had no issues with azithromycin in the past.   Currently on Symbicort 80 2 puffs BID, Singulair daily, xyzal daily, nasonex.  The  cefdinir has not helped at all.   Assessment and Plan: Andreyah is a 48 y.o. female with: Fever Fevers for 3-4 days and worsening symptoms. Cefdinir does not seem to be helping. No sick contacts.  Based on clinical history and her suppressed immune system (HIV) will send her for COVID-19 testing.  Please self-quarantine yourself until results are back.   Take Tylenol for fever control and NOT ibuprofen.   If you are feeling worse over the weekend please go to the ER for further evaluation.   Emailed handout on quarantine guidelines.   Acute non-recurrent maxillary sinusitis Not improving with cefdinir.   Stop Cefdinir  Start Z-pak. 2 tablets on day 1, then 1 tablet days 2-5.   Continue with saline wash twice a day and nasonex 1 spray twice a day.   Moderate persistent asthma without complication Stable. Daily controller medication(s): continue Symbicort 80 2 puffs twice a day with spacer and rinse mouth afterwards and continue Singulair 10mg  daily.  Prior to physical activity: May use albuterol rescue inhaler 2 puffs 5 to 15 minutes prior to strenuous physical activities. Rescue medications: May use albuterol rescue inhaler 2 puffs or nebulizer every 4 to 6 hours as needed for shortness of breath, chest tightness, coughing, and wheezing. Monitor frequency of use.   Other allergic rhinitis  Continue Xyzal 5 mg once a day.  Continue nasal saline rinses before nasal steroid (Nasonex)  Continue Nasonex- 1 spray each nostril twice a day as needed for stuffy nose. Right nostril point toward right ear. Left nostril point toward left ear  Return in about 6 months (around 02/17/2019).  Meds ordered this encounter  Medications  .  azithromycin (ZITHROMAX Z-PAK) 250 MG tablet    Sig: Take 2 tablets on day 1 then take 1 tablet days 2-5.    Dispense:  6 each    Refill:  0   Diagnostics: None.  Medication List:  Current Outpatient Medications  Medication Sig Dispense Refill  .  ACCU-CHEK FASTCLIX LANCETS MISC CHECK SUGARS TID  11  . amLODipine (NORVASC) 10 MG tablet TK 1 T PO  QD  3  . budesonide-formoterol (SYMBICORT) 80-4.5 MCG/ACT inhaler 2 puffs twice daily to prevent coughing or wheezing. Rinse, gargle and spit after use. 10.2 g 5  . celecoxib (CELEBREX) 200 MG capsule 1 po BID during menstrual period    . cetirizine (ZYRTEC) 10 MG tablet TAKE 1 TABLET BY MOUTH EVERY DAY AS NEEDED FOR RUNNY NOSE OR ITCHING 30 tablet 0  . Cholecalciferol (D 1000) 1000 units capsule Take by mouth.    . clindamycin (CLEOCIN T) 1 % lotion APP AA OF FACE BID  2  . clindamycin-benzoyl peroxide (BENZACLIN) gel APP EXT AA BID    . diclofenac (VOLTAREN) 75 MG EC tablet TK 1 T PO BID WF FOR MENSTRUAL CRAMPS ON WORST DAYS OF PERIOD EACH MONTH  2  . diclofenac sodium (VOLTAREN) 1 % GEL APP TO LEFT FOOT BID  2  . emtricitabine-tenofovir AF (DESCOVY) 200-25 MG tablet Take by mouth.    . estradiol (ESTRACE) 0.1 MG/GM vaginal cream 2 gms/vagina HS twice weekly and use small amount externally every 1-2 days    . famotidine (PEPCID) 40 MG tablet TK 1 T PO HS    . ferrous sulfate 325 (65 FE) MG tablet TAKE 1 T PO ONCE D.  5  . Fexofenadine HCl (MUCINEX ALLERGY PO) Take by mouth.    . gabapentin (NEURONTIN) 300 MG capsule TAKE 1 TO 2 CAPSULES BY MOUTH EVERY NIGHT AT BEDTIME    . glucose blood test strip TEST BLOOD SUGAR FOUR TIMES DAILY    . hydrOXYzine (ATARAX/VISTARIL) 25 MG tablet Take 1 tablet (25 mg total) by mouth at bedtime. 30 tablet 5  . ibuprofen (ADVIL,MOTRIN) 600 MG tablet Take 1 tablet (600 mg total) by mouth every 6 (six) hours as needed. 30 tablet 0  . insulin glargine (LANTUS) 100 UNIT/ML injection USE AS DIRECTED, MAXIMUM DAILY DOSE OF 30 UNITS    . Insulin Pen Needle (BD PEN NEEDLE NANO U/F) 32G X 4 MM MISC USE TO INJECT INSULIN THREE TIMES DAILY    . ipratropium (ATROVENT) 0.06 % nasal spray Place into the nose.    . ISENTRESS 400 MG tablet TK 1 T PO BID  3  . liraglutide  (VICTOZA) 18 MG/3ML SOPN INJECT 1.8 MG UNDER THE SKIN DAILY    . lubiprostone (AMITIZA) 24 MCG capsule Take by mouth.    . metFORMIN (GLUCOPHAGE) 500 MG tablet TAKE 2 TABLETS BY MOUTH TWICE DAILY    . mometasone (NASONEX) 50 MCG/ACT nasal spray USE 1 SPRAY IN EACH NOSTRIL TWICE DAILY AS NEEDED FOR STUFFY NOSE 17 g 5  . montelukast (SINGULAIR) 10 MG tablet Take-1 tablet once at night for coughing or wheezing. 90 tablet 0  . mupirocin ointment (BACTROBAN) 2 % Add one inch of ointment to 8oz sinus rinse bottle daily.    . Olopatadine HCl 0.2 % SOLN Instill one drop into each eye twice daily 2.5 mL 5  . PROAIR HFA 108 (90 Base) MCG/ACT inhaler Inhale 2 puffs into the lungs every 4 (four) hours as needed for wheezing or  shortness of breath. 18 g 1  . PROCTOZONE-HC 2.5 % rectal cream U REC TID  0  . promethazine (PHENERGAN) 25 MG tablet Take 25 mg by mouth every 6 (six) hours as needed for nausea or vomiting.    . rizatriptan (MAXALT) 10 MG tablet Take 10 mg by mouth.    . simvastatin (ZOCOR) 40 MG tablet TK 1 T PO D  2  . tranexamic acid (LYSTEDA) 650 MG TABS tablet 2 po TID at start of menses, take the first few days until flow decreases, then stop    . tretinoin (RETIN-A) 0.05 % cream APPLY TO THE AFFECTED AREA TWICE DAILY AS NEEDED    . valACYclovir (VALTREX) 1000 MG tablet TK 1 T PO D  3  . venlafaxine XR (EFFEXOR-XR) 75 MG 24 hr capsule TK 1 C PO QD    . azithromycin (ZITHROMAX Z-PAK) 250 MG tablet Take 2 tablets on day 1 then take 1 tablet days 2-5. 6 each 0  . cefdinir (OMNICEF) 300 MG capsule Take 1 capsule every 12 hours for 10 days. For infection. (Patient not taking: Reported on 08/17/2018) 20 capsule 0   No current facility-administered medications for this visit.    Allergies: Allergies  Allergen Reactions  . Sulfa Antibiotics Hives  . Sulfasalazine Hives  . Ace Inhibitors Other (See Comments)    Makes heart race  . Arnuity Ellipta [Fluticasone Propionate (Inhal)]     100 mcg  fatigue  . Lisinopril Hives  . Other Other (See Comments)    100 mcg fatigue  . Penicillins Other (See Comments)    Makes her feel bad.  . Codeine Itching   I reviewed her past medical history, social history, family history, and environmental history and no significant changes have been reported from previous visit on 08/14/2018.  Review of Systems  Constitutional: Positive for fever. Negative for appetite change, chills and unexpected weight change.  HENT: Positive for congestion. Negative for rhinorrhea.   Eyes: Negative for itching.  Respiratory: Positive for cough, chest tightness and shortness of breath. Negative for wheezing.   Cardiovascular: Negative for chest pain.  Gastrointestinal: Negative for abdominal pain.  Musculoskeletal: Positive for myalgias.  Skin: Negative for rash.  Allergic/Immunologic: Positive for environmental allergies.  Neurological: Negative for headaches.   Objective: Physical Exam  Patient was speaking in full sentences and in distress over the phone. No coughing or wheezing heard.  Not obtained as encounter was done via telephone.   Previous notes and tests were reviewed.  I discussed the assessment and treatment plan with the patient. The patient was provided an opportunity to ask questions and all were answered. The patient agreed with the plan and demonstrated an understanding of the instructions. After visit summary/patient instructions available via e-mail.   The patient was advised to call back or seek an in-person evaluation if the symptoms worsen or if the condition fails to improve as anticipated.  I provided 30 minutes of non-face-to-face time during this encounter.  It was my pleasure to participate in TiptonLeslie Mallis's care today. Please feel free to contact me with any questions or concerns.   Sincerely,  Wyline MoodYoon Kiasha Bellin, DO Allergy & Immunology  [facility]  [place] office: [phone] [facility]  office: [phone]

## 2018-08-17 NOTE — Assessment & Plan Note (Addendum)
Fevers for 3-4 days and worsening symptoms. Cefdinir does not seem to be helping. No sick contacts.  Based on clinical history and her suppressed immune system (HIV) will send her for COVID-19 testing.  Please self-quarantine yourself until results are back.   Take Tylenol for fever control and NOT ibuprofen.   If you are feeling worse over the weekend please go to the ER for further evaluation.   Emailed handout on quarantine guidelines.

## 2018-08-22 ENCOUNTER — Telehealth: Payer: Self-pay | Admitting: Family Medicine

## 2018-08-22 ENCOUNTER — Telehealth: Payer: Self-pay | Admitting: *Deleted

## 2018-08-22 LAB — NOVEL CORONAVIRUS, NAA: SARS-CoV-2, NAA: DETECTED — AB

## 2018-08-22 MED ORDER — CHOLECALCIFEROL 25 MCG (1000 UT) PO TABS
1000.00 | ORAL_TABLET | ORAL | Status: DC
Start: 2018-08-24 — End: 2018-08-22

## 2018-08-22 MED ORDER — GUAIFENESIN ER 600 MG PO TB12
600.00 | ORAL_TABLET | ORAL | Status: DC
Start: 2018-08-23 — End: 2018-08-22

## 2018-08-22 MED ORDER — RALTEGRAVIR POTASSIUM 400 MG PO TABS
400.00 | ORAL_TABLET | ORAL | Status: DC
Start: 2018-08-23 — End: 2018-08-22

## 2018-08-22 MED ORDER — FAMOTIDINE 20 MG PO TABS
40.00 | ORAL_TABLET | ORAL | Status: DC
Start: 2018-08-23 — End: 2018-08-22

## 2018-08-22 MED ORDER — CETIRIZINE HCL 10 MG PO TABS
10.00 | ORAL_TABLET | ORAL | Status: DC
Start: 2018-08-24 — End: 2018-08-22

## 2018-08-22 MED ORDER — INSULIN LISPRO 100 UNIT/ML ~~LOC~~ SOLN
3.00 | SUBCUTANEOUS | Status: DC
Start: 2018-08-23 — End: 2018-08-22

## 2018-08-22 MED ORDER — INSULIN GLARGINE 100 UNIT/ML ~~LOC~~ SOLN
40.00 | SUBCUTANEOUS | Status: DC
Start: 2018-08-23 — End: 2018-08-22

## 2018-08-22 MED ORDER — DEXTROSE 10 % IV SOLN
125.00 | INTRAVENOUS | Status: DC
Start: ? — End: 2018-08-22

## 2018-08-22 MED ORDER — ACETAMINOPHEN 325 MG PO TABS
650.00 | ORAL_TABLET | ORAL | Status: DC
Start: ? — End: 2018-08-22

## 2018-08-22 MED ORDER — LEVOFLOXACIN IN D5W 750 MG/150ML IV SOLN
750.00 | INTRAVENOUS | Status: DC
Start: 2018-08-23 — End: 2018-08-22

## 2018-08-22 MED ORDER — TRAMADOL HCL 50 MG PO TABS
50.00 | ORAL_TABLET | ORAL | Status: DC
Start: ? — End: 2018-08-22

## 2018-08-22 MED ORDER — FERROUS SULFATE 325 (65 FE) MG PO TABS
325.00 | ORAL_TABLET | ORAL | Status: DC
Start: 2018-08-24 — End: 2018-08-22

## 2018-08-22 MED ORDER — HEPARIN SODIUM (PORCINE) 5000 UNIT/ML IJ SOLN
5000.00 | INTRAMUSCULAR | Status: DC
Start: 2018-08-23 — End: 2018-08-22

## 2018-08-22 MED ORDER — HYDROXYZINE HCL 25 MG PO TABS
25.00 | ORAL_TABLET | ORAL | Status: DC
Start: ? — End: 2018-08-22

## 2018-08-22 MED ORDER — AMLODIPINE BESYLATE 5 MG PO TABS
10.00 | ORAL_TABLET | ORAL | Status: DC
Start: 2018-08-24 — End: 2018-08-22

## 2018-08-22 MED ORDER — GLUCOSE 40 % PO GEL
15.00 | ORAL | Status: DC
Start: ? — End: 2018-08-22

## 2018-08-22 MED ORDER — ATORVASTATIN CALCIUM 10 MG PO TABS
20.00 | ORAL_TABLET | ORAL | Status: DC
Start: 2018-08-23 — End: 2018-08-22

## 2018-08-22 MED ORDER — DICLOFENAC SODIUM 1 % TD GEL
2.00 | TRANSDERMAL | Status: DC
Start: 2018-08-23 — End: 2018-08-22

## 2018-08-22 MED ORDER — ONDANSETRON HCL 4 MG/2ML IJ SOLN
4.00 | INTRAMUSCULAR | Status: DC
Start: ? — End: 2018-08-22

## 2018-08-22 MED ORDER — MOMETASONE FUROATE 50 MCG/ACT NA SUSP
2.00 | NASAL | Status: DC
Start: 2018-08-24 — End: 2018-08-22

## 2018-08-22 MED ORDER — GABAPENTIN 300 MG PO CAPS
300.00 | ORAL_CAPSULE | ORAL | Status: DC
Start: 2018-08-23 — End: 2018-08-22

## 2018-08-22 MED ORDER — EMTRICITABINE-TENOFOVIR AF 200-25 MG PO TABS
1.00 | ORAL_TABLET | ORAL | Status: DC
Start: 2018-08-24 — End: 2018-08-22

## 2018-08-22 MED ORDER — BENZONATATE 100 MG PO CAPS
100.00 | ORAL_CAPSULE | ORAL | Status: DC
Start: ? — End: 2018-08-22

## 2018-08-22 MED ORDER — ALBUTEROL SULFATE HFA 108 (90 BASE) MCG/ACT IN AERS
2.00 | INHALATION_SPRAY | RESPIRATORY_TRACT | Status: DC
Start: ? — End: 2018-08-22

## 2018-08-22 MED ORDER — ESTRADIOL 0.1 MG/GM VA CREA
2.00 | TOPICAL_CREAM | VAGINAL | Status: DC
Start: 2018-08-27 — End: 2018-08-22

## 2018-08-22 MED ORDER — FLUTICASONE FUROATE-VILANTEROL 100-25 MCG/INH IN AEPB
1.00 | INHALATION_SPRAY | RESPIRATORY_TRACT | Status: DC
Start: 2018-08-24 — End: 2018-08-22

## 2018-08-22 MED ORDER — GENERIC EXTERNAL MEDICATION
100.00 | Status: DC
Start: 2018-08-24 — End: 2018-08-22

## 2018-08-22 MED ORDER — VENLAFAXINE HCL ER 37.5 MG PO CP24
75.00 | ORAL_CAPSULE | ORAL | Status: DC
Start: 2018-08-24 — End: 2018-08-22

## 2018-08-22 MED ORDER — GLUCAGON HCL RDNA (DIAGNOSTIC) 1 MG IJ SOLR
1.00 | INTRAMUSCULAR | Status: DC
Start: ? — End: 2018-08-22

## 2018-08-22 MED ORDER — UMECLIDINIUM BROMIDE 62.5 MCG/INH IN AEPB
1.00 | INHALATION_SPRAY | RESPIRATORY_TRACT | Status: DC
Start: ? — End: 2018-08-22

## 2018-08-22 NOTE — Telephone Encounter (Signed)
Patient's mother reports that Rabecca went to her PCP after receiving a positive covid19 result and they sent her to [facility] where she was admitted on Monday. Her mother reports that she is beginning to feel better at this time, however, she remains in the hospital.

## 2018-08-22 NOTE — Telephone Encounter (Signed)
[employer] [facility]- calling to report positive COVID testing results. Verified results in chart and PCP has been notified.

## 2018-08-23 MED ORDER — INSULIN GLARGINE 100 UNIT/ML ~~LOC~~ SOLN
30.00 | SUBCUTANEOUS | Status: DC
Start: 2018-08-24 — End: 2018-08-23

## 2018-10-05 ENCOUNTER — Other Ambulatory Visit: Payer: Self-pay | Admitting: Family Medicine

## 2019-02-18 ENCOUNTER — Other Ambulatory Visit: Payer: Self-pay

## 2019-02-18 ENCOUNTER — Encounter: Payer: Self-pay | Admitting: Family Medicine

## 2019-02-18 ENCOUNTER — Ambulatory Visit (INDEPENDENT_AMBULATORY_CARE_PROVIDER_SITE_OTHER): Payer: Medicare Other | Admitting: Family Medicine

## 2019-02-18 VITALS — BP 116/66 | HR 90 | Temp 98.8°F | Resp 16 | Ht 60.2 in | Wt 201.4 lb

## 2019-02-18 DIAGNOSIS — J3081 Allergic rhinitis due to animal (cat) (dog) hair and dander: Secondary | ICD-10-CM | POA: Diagnosis not present

## 2019-02-18 DIAGNOSIS — H1010 Acute atopic conjunctivitis, unspecified eye: Secondary | ICD-10-CM | POA: Diagnosis not present

## 2019-02-18 DIAGNOSIS — Z21 Asymptomatic human immunodeficiency virus [HIV] infection status: Secondary | ICD-10-CM

## 2019-02-18 DIAGNOSIS — J4540 Moderate persistent asthma, uncomplicated: Secondary | ICD-10-CM

## 2019-02-18 DIAGNOSIS — K219 Gastro-esophageal reflux disease without esophagitis: Secondary | ICD-10-CM | POA: Diagnosis not present

## 2019-02-18 MED ORDER — MONTELUKAST SODIUM 10 MG PO TABS: ORAL_TABLET | ORAL | 1 refills | Status: DC

## 2019-02-18 MED ORDER — PROAIR HFA 108 (90 BASE) MCG/ACT IN AERS: 2.0000 | INHALATION_SPRAY | RESPIRATORY_TRACT | 1 refills | Status: AC | PRN

## 2019-02-18 MED ORDER — MOMETASONE FUROATE 50 MCG/ACT NA SUSP: NASAL | 5 refills | Status: DC

## 2019-02-18 MED ORDER — BUDESONIDE-FORMOTEROL FUMARATE 80-4.5 MCG/ACT IN AERO: INHALATION_SPRAY | RESPIRATORY_TRACT | 5 refills | Status: AC

## 2019-02-18 MED ORDER — PAZEO 0.7 % OP SOLN: 1.0000 [drp] | OPHTHALMIC | 5 refills | Status: DC

## 2019-02-18 NOTE — Patient Instructions (Addendum)
Asthma Continue Symbicort 80/4.5, inhale 2 puffs with a spacer every 12 hours to prevent cough or wheeze. Rinse, gargle, and spit out after each use. Continue Montelukast 10 mg daily to prevent cough or wheeze  ProAir inhale 2 puffs every 4 hours with a spacer as needed for cough or wheeze.  Use your ProAir inhaler as needed instead of part of a routine  Allergic rhinitis Stop Claritin D as this may raise your blood pressure. Hold Xyzal for the next 5 days, then continue Xyzal 5 mg once a day as needed for runny nose.  Continue nasal saline rinses before nasal steroid (Nasonex) Continue Nasonex- 1 spray each nostril twice a day as needed for stuffy nose. Right nostril point toward right ear. Left nostril point toward left ear Consider nasal saline gel once a day for dry nose Begin Mucinex [phone] mg twice a day and increase hydration as tolerated to thin mucus Mold avoidance measures included below  Allergic conjunctivitis Begin Pazeo one drop in each eye once a day as needed for red, itchy eyes. This will replace Pataday  Reflux Continue famotidine 40 mg once a day as previously prescribed Continue dietary and lifestyle modifications as listed below   Continue other medications as outlined in the chart  Call if this treatment plan is not working for you  Follow up in 2 months or sooner if needed   Lifestyle Changes for Controlling GERD When you have GERD, stomach acid feels as if it's backing up toward your mouth. Whether or not you take medication to control your GERD, your symptoms can often be improved with lifestyle changes.   Raise Your Head  Reflux is more likely to strike when you're lying down flat, because stomach fluid can  flow backward more easily. Raising the head of your bed 4-6 inches can help. To do this:  Slide blocks or books under the legs at the head of your bed. Or, place a wedge under  the mattress. Many foam stores can make a suitable wedge for you.  The wedge  should run from your waist to the top of your head.  Don't just prop your head on several pillows. This increases pressure on your  stomach. It can make GERD worse.  Watch Your Eating Habits Certain foods may increase the acid in your stomach or relax the lower esophageal sphincter, making GERD more likely. It's best to avoid the following:  Coffee, tea, and carbonated drinks (with and without caffeine)  Fatty, fried, or spicy food  Mint, chocolate, onions, and tomatoes  Any other foods that seem to irritate your stomach or cause you pain  Relieve the Pressure  Eat smaller meals, even if you have to eat more often.  Don't lie down right after you eat. Wait a few hours for your stomach to empty.  Avoid tight belts and tight-fitting clothes.  Lose excess weight.  Tobacco and Alcohol  Avoid smoking tobacco and drinking alcohol. They can make GERD symptoms worse.  Control of Mold Allergen Mold and fungi can grow on a variety of surfaces provided certain temperature and moisture conditions exist.  Outdoor molds grow on plants, decaying vegetation and soil.  The major outdoor mold, Alternaria and Cladosporium, are found in very high numbers during hot and dry conditions.  Generally, a late Summer - Fall peak is seen for common outdoor fungal spores.  Rain will temporarily lower outdoor mold spore count, but counts rise rapidly when the rainy period ends.  The most  important indoor molds are Aspergillus and Penicillium.  Dark, humid and poorly ventilated basements are ideal sites for mold growth.  The next most common sites of mold growth are the bathroom and the kitchen.  Outdoor [employer] 1. Use air conditioning and keep windows closed 2. Avoid exposure to decaying vegetation. 3. Avoid leaf raking. 4. Avoid grain handling. 5. Consider wearing a face mask if working in moldy areas.  Indoor Mold Control 1. Maintain humidity below 50%. 2. Clean washable surfaces  with 5% bleach solution. 3. Remove sources e.g. Contaminated carpets.

## 2019-02-18 NOTE — Progress Notes (Signed)
[address] [place] [address] Dept: [phone]  FOLLOW UP NOTE  Patient ID: Amber Clark, female    DOB: 10/05/1970  Age: 48 y.o. MRN: [id number] Date of Office Visit: 02/18/2019  Assessment  Chief Complaint: Allergic Rhinitis  (alot of nasal congestion and headaches.), Asthma, and Cough  HPI Amber Clark is a 48 year old female who presents to the clinic for a follow up visit. She was last seen in this clinic on 08/17/2018 by Dr. Maudie Mercury for evaluation of asthma, cough, worsening fever, and allergic rhinitis. In the interim, she was hospitalized in May with COVID resulting in pneumonia. At today's visit, she reports that for the last few days she has been experiencing intermittent headache and nasal congestion which have been aggravated by the weather change. She continues to take Claritin D every 12 hours, Nasonex as needed, and saline nasal sprays as needed. She demonstrates poor nasal steroid application technique in the clinic. She reports asthma as moderately well controlled with some shortness of breath with activity and intermittent dry cough. She continues Symbicort 80-2 puffs twice a day with a spacer, montelukast 10 mg once a day ans uses albuterol on a schedule twice a day. She denies heartburn or reflux and continues famotidine 40 mg once a day. Allergic rhinitis is reported as well controlled with Pataday eye drops. Her current medications are listed in the chart.    Drug Allergies:  Allergies  Allergen Reactions  . Sulfa Antibiotics Hives  . Sulfasalazine Hives  . Ace Inhibitors Other (See Comments)    Makes heart race  . Arnuity Ellipta [Fluticasone Propionate (Inhal)]     100 mcg fatigue  . Lisinopril Hives  . Other Other (See Comments)    100 mcg fatigue  . Penicillins Other (See Comments)    Makes her feel bad.  . Codeine Itching    Physical Exam: BP 116/66 (BP Location: Left Arm, Patient Position: Sitting, Cuff Size: Large)   Pulse 90   Temp 98.8 F  (37.1 C) (Oral)   Resp 16   Ht 5' 0.2" (1.529 m)   Wt 201 lb 6.4 oz (91.4 kg)   SpO2 100%   BMI 39.07 kg/m    Physical Exam Vitals signs reviewed.  Constitutional:      Appearance: Normal appearance.  HENT:     Head: Normocephalic and atraumatic.     Right Ear: Tympanic membrane normal.     Left Ear: Tympanic membrane normal.     Nose:     Comments: Bilateral nares edematous and red with no nasal drainage noted. Pharynx normal. Ears normal. Eyes normal.    Mouth/Throat:     Pharynx: Oropharynx is clear.  Eyes:     Conjunctiva/sclera: Conjunctivae normal.  Neck:     Musculoskeletal: Normal range of motion and neck supple.  Cardiovascular:     Rate and Rhythm: Normal rate and regular rhythm.     Heart sounds: Normal heart sounds. No murmur.  Pulmonary:     Effort: Pulmonary effort is normal.     Breath sounds: Normal breath sounds.     Comments: Lungs clear to auscultation Musculoskeletal: Normal range of motion.  Skin:    General: Skin is warm and dry.  Neurological:     Mental Status: She is alert and oriented to person, place, and time.  Psychiatric:        Mood and Affect: Mood normal.        Behavior: Behavior normal.  Thought Content: Thought content normal.        Judgment: Judgment normal.     Diagnostics: FVC 1.60, FEV1 1.44. Predicted FVC 2.53, predicted FEV1 2.05. Spirometry indicates moderate restriction. Post bronchodilator therapy FVC 1.55, FEV1 1.42. Post bronchodilator therapy spirometry indicates no significant improvement. This is consistent with previous spirometry readings.    Assessment and Plan: 1. Moderate persistent asthma without complication   2. Allergic rhinitis due to animal hair and dander   3. Seasonal allergic conjunctivitis   4. Gastroesophageal reflux disease, unspecified whether esophagitis present   5. Asymptomatic HIV infection (HCC)     Meds ordered this encounter  Medications  . Olopatadine HCl (PAZEO) 0.7 % SOLN     Sig: Place 1 drop into both eyes 1 day or 1 dose.    Dispense:  2.5 mL    Refill:  5  . montelukast (SINGULAIR) 10 MG tablet    Sig: Take-1 tablet once at night for coughing or wheezing.    Dispense:  90 tablet    Refill:  1  . mometasone (NASONEX) 50 MCG/ACT nasal spray    Sig: 1-2 sprays in each nostril once a day.  In the right nostril, point the applicator out toward the right ear. In the left nostril, point the applicator out toward the left ear    Dispense:  17 g    Refill:  5  . budesonide-formoterol (SYMBICORT) 80-4.5 MCG/ACT inhaler    Sig: 2 puffs twice daily to prevent coughing or wheezing. Rinse, gargle and spit after use.    Dispense:  10.2 g    Refill:  5  . PROAIR HFA 108 (90 Base) MCG/ACT inhaler    Sig: Inhale 2 puffs into the lungs every 4 (four) hours as needed for wheezing or shortness of breath.    Dispense:  18 g    Refill:  1    Patient Instructions  Asthma Continue Symbicort 80/4.5, inhale 2 puffs with a spacer every 12 hours to prevent cough or wheeze. Rinse, gargle, and spit out after each use. Continue Montelukast 10 mg daily to prevent cough or wheeze  ProAir inhale 2 puffs every 4 hours with a spacer as needed for cough or wheeze.  Use your ProAir inhaler as needed instead of part of a routine  Allergic rhinitis Stop Claritin D as this may raise your blood pressure. Hold Xyzal for the next 5 days, then continue Xyzal 5 mg once a day as needed for runny nose.  Continue nasal saline rinses before nasal steroid (Nasonex) Continue Nasonex- 1 spray each nostril twice a day as needed for stuffy nose. Right nostril point toward right ear. Left nostril point toward left ear Consider nasal saline gel once a day for dry nose Begin Mucinex [phone] mg twice a day and increase hydration as tolerated to thin mucus Mold avoidance measures included below  Allergic conjunctivitis Begin Pazeo one drop in each eye once a day as needed for red, itchy eyes. This will  replace Pataday  Reflux Continue famotidine 40 mg once a day as previously prescribed Continue dietary and lifestyle modifications as listed below   Continue other medications as outlined in the chart  Call if this treatment plan is not working for you  Follow up in 2 months or sooner if needed   Return in about 2 months (around 04/20/2019), or if symptoms worsen or fail to improve.    Thank you for the opportunity to care for this patient.  Please  do not hesitate to contact me with questions.  Gareth Morgan, FNP [facility]

## 2019-02-20 NOTE — Addendum Note (Signed)
Addended by: Katherina Right D on: 02/20/2019 09:48 AM   Modules accepted: Orders

## 2019-04-22 ENCOUNTER — Ambulatory Visit: Payer: Medicare Other | Admitting: Family Medicine

## 2019-04-29 ENCOUNTER — Encounter: Payer: Self-pay | Admitting: Family Medicine

## 2019-04-29 ENCOUNTER — Ambulatory Visit (INDEPENDENT_AMBULATORY_CARE_PROVIDER_SITE_OTHER): Payer: Medicare Other | Admitting: Family Medicine

## 2019-04-29 ENCOUNTER — Other Ambulatory Visit: Payer: Self-pay

## 2019-04-29 VITALS — BP 126/70 | HR 80 | Temp 98.3°F | Resp 20 | Ht 60.0 in | Wt 206.4 lb

## 2019-04-29 DIAGNOSIS — K219 Gastro-esophageal reflux disease without esophagitis: Secondary | ICD-10-CM | POA: Diagnosis not present

## 2019-04-29 DIAGNOSIS — J4541 Moderate persistent asthma with (acute) exacerbation: Secondary | ICD-10-CM | POA: Diagnosis not present

## 2019-04-29 DIAGNOSIS — H1010 Acute atopic conjunctivitis, unspecified eye: Secondary | ICD-10-CM

## 2019-04-29 DIAGNOSIS — J3081 Allergic rhinitis due to animal (cat) (dog) hair and dander: Secondary | ICD-10-CM | POA: Diagnosis not present

## 2019-04-29 DIAGNOSIS — Z21 Asymptomatic human immunodeficiency virus [HIV] infection status: Secondary | ICD-10-CM

## 2019-04-29 MED ORDER — PAZEO 0.7 % OP SOLN: 1.0000 [drp] | Freq: Every day | OPHTHALMIC | 5 refills | Status: AC

## 2019-04-29 NOTE — Patient Instructions (Addendum)
Asthma Increase Symbicort 80-4.5 to 2 puffs with a spacer every 12 hours to prevent cough or wheeze. Rinse, gargle, and spit out after each use. Continue Montelukast 10 mg daily to prevent cough or wheeze  ProAir inhale 2 puffs every 4 hours with a spacer as needed for cough or wheeze.  Use your ProAir inhaler as needed instead of part of a routine  Allergic rhinitis Hold Xyzal for the next 5 days, then continue Xyzal 5 mg once a day as needed for runny nose.  Continue nasal saline rinses at least once a day Consider nasal saline gel as needed for dry nose Begin Mucinex [phone] mg twice a day and increase hydration as tolerated to thin mucus Mold avoidance measures included below  Acute sinusitis Continue Doxycycline as previously prescribed Continue treatment plan for allergic rhinitis  Allergic conjunctivitis Begin Pazeo one drop in each eye once a day as needed for red, itchy eyes. This will replace Pataday  Reflux Continue famotidine 40 mg once a day as previously prescribed Continue dietary and lifestyle modifications as listed below   Continue other medications as outlined in the chart  Call if this treatment plan is not working for you  Follow up in 1 month or sooner if needed   Lifestyle Changes for Controlling GERD When you have GERD, stomach acid feels as if it's backing up toward your mouth. Whether or not you take medication to control your GERD, your symptoms can often be improved with lifestyle changes.   Raise Your Head  Reflux is more likely to strike when you're lying down flat, because stomach fluid can  flow backward more easily. Raising the head of your bed 4-6 inches can help. To do this:  Slide blocks or books under the legs at the head of your bed. Or, place a wedge under  the mattress. Many foam stores can make a suitable wedge for you. The wedge  should run from your waist to the top of your head.  Don't just prop your head on several pillows.  This increases pressure on your  stomach. It can make GERD worse.  Watch Your Eating Habits Certain foods may increase the acid in your stomach or relax the lower esophageal sphincter, making GERD more likely. It's best to avoid the following:  Coffee, tea, and carbonated drinks (with and without caffeine)  Fatty, fried, or spicy food  Mint, chocolate, onions, and tomatoes  Any other foods that seem to irritate your stomach or cause you pain  Relieve the Pressure  Eat smaller meals, even if you have to eat more often.  Don't lie down right after you eat. Wait a few hours for your stomach to empty.  Avoid tight belts and tight-fitting clothes.  Lose excess weight.  Tobacco and Alcohol  Avoid smoking tobacco and drinking alcohol. They can make GERD symptoms worse.  Control of Mold Allergen Mold and fungi can grow on a variety of surfaces provided certain temperature and moisture conditions exist.  Outdoor molds grow on plants, decaying vegetation and soil.  The major outdoor mold, Alternaria and Cladosporium, are found in very high numbers during hot and dry conditions.  Generally, a late Summer - Fall peak is seen for common outdoor fungal spores.  Rain will temporarily lower outdoor mold spore count, but counts rise rapidly when the rainy period ends.  The most important indoor molds are Aspergillus and Penicillium.  Dark, humid and poorly ventilated basements are ideal sites for mold growth.  The  next most common sites of mold growth are the bathroom and the kitchen.  Outdoor [employer] 1. Use air conditioning and keep windows closed 2. Avoid exposure to decaying vegetation. 3. Avoid leaf raking. 4. Avoid grain handling. 5. Consider wearing a face mask if working in moldy areas.  Indoor Mold Control 1. Maintain humidity below 50%. 2. Clean washable surfaces with 5% bleach solution. 3. Remove sources e.g. Contaminated carpets.

## 2019-04-29 NOTE — Progress Notes (Signed)
[address] [place] [address] Dept: [phone]  FOLLOW UP NOTE  Patient ID: Amber Clark, female    DOB: [birth year]  Age: 49 y.o. MRN: [id number] Date of Office Visit: 04/29/2019  Assessment  Chief Complaint: Allergic Rhinitis  and Asthma  HPI Amber Clark is a 49 year old female who presents to the clinic for a follow up visit. She was last seen in this clinic on 02/18/2019 for evaluation of asthma, allergic rhinitis, allergic conjunctivitis, and reflux. She has a history of well controlled HIV and type 2 diabetes with daily insulin use. In the interim, she saw Dr. Courtney Heys, ENT specialist, on 04/25/2019 at which time she started Doxycycline 100 mg twice a day for 10 days and stopped her Nasacort as this was suspected of causing nose bleeds. At today's visit, she reports her asthma has been moderately well controlled with occasional shortness of breath and wheeze with activity and occasional wheeze at night. She reports a dry intermittent cough occurring during the day and night. She is currently taking montelukast 10 mg once a day, Symbicort 80 with a spacer when she "remembers" which is about 1-2 times a week and albuterol 1-2 times a week. Allergic rhinitis is reported as not well controlled with nasal congestion. She continues nasal saline rinses at least once a day and occasionally uses Atrovent nasal spray. She has not tried nasal saline gel at this time. Reflux is reported as well controlled with famotidine 40 mg once a day. Her current medications are listed in the chart.    Drug Allergies:  Allergies  Allergen Reactions  . Sulfa Antibiotics Hives  . Sulfasalazine Hives  . Ace Inhibitors Other (See Comments)    Makes heart race  . Arnuity Ellipta [Fluticasone Propionate (Inhal)]     100 mcg fatigue  . Lisinopril Hives  . Other Other (See Comments)    100 mcg fatigue  . Penicillins Other (See Comments)    Makes her feel bad.  . Codeine Itching    Physical Exam:  BP 126/70 (BP Location: Right Arm, Patient Position: Sitting, Cuff Size: Large)   Pulse 80   Temp 98.3 F (36.8 C) (Oral)   Resp 20   Ht 5' (1.524 m)   Wt 206 lb 6.4 oz (93.6 kg)   SpO2 99%   BMI 40.31 kg/m    Physical Exam Vitals reviewed.  Constitutional:      Appearance: Normal appearance.  HENT:     Head: Normocephalic and atraumatic.     Right Ear: Tympanic membrane normal.     Left Ear: Tympanic membrane normal.     Nose:     Comments: Bilateral nares erythematous with clear nasal drainage noted. Pharynx normal. Ears normal. Eyes normal.    Mouth/Throat:     Pharynx: Oropharynx is clear.  Eyes:     Conjunctiva/sclera: Conjunctivae normal.  Cardiovascular:     Rate and Rhythm: Normal rate and regular rhythm.     Heart sounds: Normal heart sounds. No murmur.  Pulmonary:     Effort: Pulmonary effort is normal.     Breath sounds: Normal breath sounds.     Comments: Lungs clear to auscultation Musculoskeletal:        General: Normal range of motion.     Cervical back: Normal range of motion and neck supple.  Skin:    General: Skin is warm and dry.  Neurological:     Mental Status: She is alert and oriented to person, place, and  time.  Psychiatric:        Mood and Affect: Mood normal.        Behavior: Behavior normal.        Thought Content: Thought content normal.        Judgment: Judgment normal.     Diagnostics: FVC 1.35, FEV1 1.15. Predicted FVC 2.53, predicted FEV1 2.05. Spirometry indicates moderate restriction.   Assessment and Plan: 1. Moderate persistent asthma with acute exacerbation   2. Allergic rhinitis due to animal hair and dander   3. Seasonal allergic conjunctivitis   4. Gastroesophageal reflux disease, unspecified whether esophagitis present   5. Asymptomatic HIV infection ([place])     Meds ordered this encounter  Medications  . Olopatadine HCl (PAZEO) 0.7 % SOLN    Sig: Place 1 drop into both eyes daily.    Dispense:  2.5 mL    Refill:   5    Patient Instructions  Asthma Increase Symbicort 80-4.5 to 2 puffs with a spacer every 12 hours to prevent cough or wheeze. Rinse, gargle, and spit out after each use. Continue Montelukast 10 mg daily to prevent cough or wheeze  ProAir inhale 2 puffs every 4 hours with a spacer as needed for cough or wheeze.  Use your ProAir inhaler as needed instead of part of a routine  Allergic rhinitis Hold Xyzal for the next 5 days, then continue Xyzal 5 mg once a day as needed for runny nose.  Continue nasal saline rinses at least once a day Consider nasal saline gel as needed for dry nose Begin Mucinex [phone] mg twice a day and increase hydration as tolerated to thin mucus Mold avoidance measures included below  Acute sinusitis Continue Doxycycline as previously prescribed Continue treatment plan for allergic rhinitis  Allergic conjunctivitis Begin Pazeo one drop in each eye once a day as needed for red, itchy eyes. This will replace Pataday  Reflux Continue famotidine 40 mg once a day as previously prescribed Continue dietary and lifestyle modifications as listed below   Continue other medications as outlined in the chart  Call if this treatment plan is not working for you  Follow up in 1 month or sooner if needed   Return in about 4 weeks (around 05/27/2019), or if symptoms worsen or fail to improve.    Thank you for the opportunity to care for this patient.  Please do not hesitate to contact me with questions.  Gareth Morgan, FNP [facility]

## 2019-04-30 ENCOUNTER — Other Ambulatory Visit: Payer: Self-pay

## 2019-04-30 MED ORDER — OLOPATADINE HCL 0.2 % OP SOLN: 1.0000 [drp] | Freq: Every day | OPHTHALMIC | 5 refills | Status: AC | PRN

## 2019-05-27 ENCOUNTER — Ambulatory Visit (INDEPENDENT_AMBULATORY_CARE_PROVIDER_SITE_OTHER): Payer: Medicare Other | Admitting: Family Medicine

## 2019-05-27 ENCOUNTER — Encounter: Payer: Self-pay | Admitting: Family Medicine

## 2019-05-27 ENCOUNTER — Other Ambulatory Visit: Payer: Self-pay

## 2019-05-27 VITALS — BP 110/60 | HR 83 | Temp 99.0°F | Resp 16

## 2019-05-27 DIAGNOSIS — K219 Gastro-esophageal reflux disease without esophagitis: Secondary | ICD-10-CM

## 2019-05-27 DIAGNOSIS — Z794 Long term (current) use of insulin: Secondary | ICD-10-CM

## 2019-05-27 DIAGNOSIS — H1010 Acute atopic conjunctivitis, unspecified eye: Secondary | ICD-10-CM | POA: Diagnosis not present

## 2019-05-27 DIAGNOSIS — E119 Type 2 diabetes mellitus without complications: Secondary | ICD-10-CM

## 2019-05-27 DIAGNOSIS — J3089 Other allergic rhinitis: Secondary | ICD-10-CM

## 2019-05-27 DIAGNOSIS — J4540 Moderate persistent asthma, uncomplicated: Secondary | ICD-10-CM | POA: Diagnosis not present

## 2019-05-27 DIAGNOSIS — Z21 Asymptomatic human immunodeficiency virus [HIV] infection status: Secondary | ICD-10-CM

## 2019-05-27 NOTE — Patient Instructions (Addendum)
Asthma Increase Symbicort 80-4.5 to 2 puffs with a spacer once a day to prevent cough or wheeze. Rinse, gargle, and spit out after each use. Continue Montelukast 10 mg daily to prevent cough or wheeze  ProAir inhale 2 puffs every 4 hours with a spacer as needed for cough or wheeze.  Use your ProAir inhaler as needed instead of part of a routine  Allergic rhinitis Restart Nasonex 1 spray in each nostril once a day as needed for a stuffy nose.  In the right nostril, point the applicator out toward the right ear. In the left nostril, point the applicator out toward the left ear. If your nose begins to bleed-stop using Nasonex Prednisone 10 mg twice a day for 4 days, then take 1 tablet on the 5th day, then stop. Continue to check your blood sugar as you have been.  Hold Xyzal for the next 5 days, then continue Xyzal 5 mg once a day as needed for runny nose.  Continue nasal saline rinses, increase to twice a day Consider nasal saline gel as needed for dry nose Begin Mucinex [phone] mg twice a day and increase hydration as tolerated to thin mucus Mold avoidance measures included below Consider environmental allergy skin testing and allergen immunotherapy Follow up with ENT for evaluation and treatment  Allergic conjunctivitis Begin Pazeo one drop in each eye once a day as needed for red, itchy eyes. This will replace Pataday  Reflux Continue famotidine 40 mg once a day as previously prescribed Continue dietary and lifestyle modifications as listed below   Continue other medications as outlined in the chart  Call if this treatment plan is not working for you  Follow up in 3 months or sooner if needed

## 2019-05-27 NOTE — Progress Notes (Signed)
[address] [place] [address] Dept: [phone]  FOLLOW UP NOTE  Patient ID: Amber Clark, female    DOB: 1971/03/01  Age: 49 y.o. MRN: [id number] Date of Office Visit: 05/27/2019  Assessment  Chief Complaint: Asthma and Allergic Rhinitis   HPI Amber Clark is a 49 year old female who presents to the clinic for a follow up visit. She was last seen in this clinic on 04/29/2019 for evaluation of asthma, allergic rhinitis, acute sinusitis, allergic conjunctivitis, and reflux. In the interim, she has completed cefdinir for acute sinusitis and reports that she continues to experience nasal congestion, dry nostrils, yellow nasal drainage occasionally mixed with dried blood flakes. She reports that she is using nasal saline rinses once a day, ipratropium nasal spray as needed, and Mucinex as needed. She reports asthma as moderately well controlled with occasional shortness of breath, wheeze, and cough with yellow to white sputum. She continues montelukast 10 mg once a day, albuterol 1 to 2 times a day, and Symbicort 80 about 1 time a week. Reflux is reported as well controlled with famotidine 40 mg once a day. Her current medications are listed in the chart.    Drug Allergies:  Allergies  Allergen Reactions  . Sulfa Antibiotics Hives  . Sulfasalazine Hives  . Ace Inhibitors Other (See Comments)    Makes heart race  . Arnuity Ellipta [Fluticasone Propionate (Inhal)]     100 mcg fatigue  . Lisinopril Hives  . Other Other (See Comments)    100 mcg fatigue  . Penicillins Other (See Comments)    Makes her feel bad.  . Codeine Itching    Physical Exam: BP 110/60 (BP Location: Right Arm, Patient Position: Sitting, Cuff Size: Large)   Pulse 83   Temp 99 F (37.2 C) (Oral)   Resp 16   SpO2 97%    Physical Exam Vitals reviewed.  Constitutional:      Appearance: Normal appearance.  HENT:     Head: Normocephalic and atraumatic.     Right Ear: Tympanic membrane normal.     Left  Ear: Tympanic membrane normal.     Nose:     Comments: Bilateral nares edematous with clear nasal drainage noted. Pharynx slightly erythematous with no exudate. Ears normal. Eyes normal. Eyes:     Conjunctiva/sclera: Conjunctivae normal.  Cardiovascular:     Rate and Rhythm: Normal rate and regular rhythm.     Heart sounds: Normal heart sounds. No murmur.  Pulmonary:     Effort: Pulmonary effort is normal.     Breath sounds: Normal breath sounds.     Comments: Lungs clear to auscultation Musculoskeletal:        General: Normal range of motion.     Cervical back: Normal range of motion and neck supple.  Skin:    General: Skin is warm and dry.  Neurological:     Mental Status: She is alert and oriented to person, place, and time.  Psychiatric:        Mood and Affect: Mood normal.        Behavior: Behavior normal.        Thought Content: Thought content normal.        Judgment: Judgment normal.     Diagnostics: FVC 1.61, FEV1 1.34. Predicted FVC 2.53, FEV1 2.05. Spirometry indicates moderate restriction. This is consistent with previous spirometry results.   Assessment and Plan: 1. Moderate persistent asthma without complication   2. Other allergic rhinitis   3. Seasonal  allergic conjunctivitis   4. Gastroesophageal reflux disease, unspecified whether esophagitis present   5. Asymptomatic HIV infection (HCC)   6. Type 2 diabetes mellitus without complication, with long-term current use of insulin (HCC)     Patient Instructions  Asthma Increase Symbicort 80-4.5 to 2 puffs with a spacer once a day to prevent cough or wheeze. Rinse, gargle, and spit out after each use. Continue Montelukast 10 mg daily to prevent cough or wheeze  ProAir inhale 2 puffs every 4 hours with a spacer as needed for cough or wheeze.  Use your ProAir inhaler as needed instead of part of a routine  Allergic rhinitis Restart Nasonex 1 spray in each nostril once a day as needed for a stuffy nose.  In the  right nostril, point the applicator out toward the right ear. In the left nostril, point the applicator out toward the left ear. If your nose begins to bleed-stop using Nasonex Prednisone 10 mg twice a day for 4 days, then take 1 tablet on the 5th day, then stop. Continue to check your blood sugar as you have been.  Hold Xyzal for the next 5 days, then continue Xyzal 5 mg once a day as needed for runny nose.  Continue nasal saline rinses, increase to twice a day Consider nasal saline gel as needed for dry nose Begin Mucinex [id number] mg twice a day and increase hydration as tolerated to thin mucus Mold avoidance measures included below Consider environmental allergy skin testing and allergen immunotherapy Follow up with ENT for evaluation and treatment  Allergic conjunctivitis Begin Pazeo one drop in each eye once a day as needed for red, itchy eyes. This will replace Pataday  Reflux Continue famotidine 40 mg once a day as previously prescribed Continue dietary and lifestyle modifications as listed below   Continue other medications as outlined in the chart  Call if this treatment plan is not working for you  Follow up in 3 months or sooner if needed   Return in about 3 months (around 08/27/2019), or if symptoms worsen or fail to improve.    Thank you for the opportunity to care for this patient.  Please do not hesitate to contact me with questions.  Tonette Bihari, M.D.  [facility] [address] [place], [place] [address] [phone]

## 2019-05-28 ENCOUNTER — Other Ambulatory Visit: Payer: Self-pay

## 2019-05-28 DIAGNOSIS — J3089 Other allergic rhinitis: Secondary | ICD-10-CM

## 2019-05-28 NOTE — Addendum Note (Signed)
Addended by: Virl Son D on: 05/28/2019 02:16 PM   Modules accepted: Orders

## 2019-05-31 ENCOUNTER — Telehealth: Payer: Self-pay | Admitting: Family Medicine

## 2019-05-31 NOTE — Telephone Encounter (Signed)
Pt should have contacted insurance before beginning allergy immunotherapy to make sure it was covered. I am not sure if anything can be done at this point. I have never done a PA and wouldn't even know where to begin.

## 2019-05-31 NOTE — Telephone Encounter (Signed)
[employer] rep, April called with PT on the line stating allergy immunotheraphy is not covered by her insurance. [employer] rep asked that we send a PA. Provided 2 contact numbers [phone] & [phone] but the rep was unsure if these were phone or fax numbers.

## 2019-05-31 NOTE — Telephone Encounter (Signed)
I went back and looked at her account. I do not see that she has started allergy injections yet or has been scheduled to start. Is she wanting to start? If so, I do not do PA's. Thanks.

## 2019-06-03 ENCOUNTER — Ambulatory Visit: Payer: Medicare Other | Admitting: Family Medicine

## 2019-06-03 NOTE — Telephone Encounter (Signed)
Thank you. This patient is getting some lab work done to help [place] evaluate her environmental allergies. Allergen immunotherapy was just one of the treatment options discussed during her last appointment. She was going to call her insurance company to inquire about cost and coverage to find out if this was a feasible option. At this point, no allergy testing has been completed and allergen immunotherapy has not been ordered. Thank you.

## 2019-06-03 NOTE — Telephone Encounter (Signed)
No PA is needed at this time. Will await further instructions with lab results.

## 2019-06-03 NOTE — Telephone Encounter (Signed)
This patient has not started allergy injections yet. I believe we are still waiting on the blood work to come back.

## 2019-06-05 LAB — ALLERGENS, ZONE 2

## 2019-06-06 NOTE — Telephone Encounter (Signed)
Patient called requesting results of recent labs.  

## 2019-06-06 NOTE — Telephone Encounter (Signed)
Can you please let this patient know that her environmental allergy blood testing was negative. Can you please ask her how her congestion is coming along? If her sinus symptoms continue to occur, she should consider an evaluation with ENT. Thank you.

## 2019-06-06 NOTE — Telephone Encounter (Signed)
Lab results given to patient along with your request for her to see her ENT for sinus symptoms. She states, congestion is the same and not any better.

## 2019-06-09 ENCOUNTER — Other Ambulatory Visit: Payer: Self-pay | Admitting: Family Medicine

## 2020-01-11 ENCOUNTER — Ambulatory Visit: Payer: Medicare Other | Attending: Internal Medicine

## 2020-01-11 DIAGNOSIS — Z23 Encounter for immunization: Secondary | ICD-10-CM

## 2020-01-11 NOTE — Progress Notes (Signed)
   Covid-19 Vaccination Clinic  Name:  Amber Clark    MRN: [id number] DOB: 04-16-1970  01/11/2020  Amber Clark was observed post Covid-19 immunization for 30 minutes based on pre-vaccination screening without incident. She was provided with Vaccine Information Sheet and instruction to access the V-Safe system.   Amber Clark was instructed to call 911 with any severe reactions post vaccine: Marland Kitchen Difficulty breathing  . Swelling of face and throat  . A fast heartbeat  . A bad rash all over body  . Dizziness and weakness

## 2020-06-07 ENCOUNTER — Emergency Department (HOSPITAL_BASED_OUTPATIENT_CLINIC_OR_DEPARTMENT_OTHER): Payer: 59

## 2020-06-07 ENCOUNTER — Emergency Department (HOSPITAL_BASED_OUTPATIENT_CLINIC_OR_DEPARTMENT_OTHER)
Admission: EM | Admit: 2020-06-07 | Discharge: 2020-06-07 | Disposition: A | Discharge status: Home / Self Care | Payer: 59 | Attending: Emergency Medicine | Admitting: Emergency Medicine

## 2020-06-07 ENCOUNTER — Other Ambulatory Visit: Payer: Self-pay

## 2020-06-07 ENCOUNTER — Encounter (HOSPITAL_BASED_OUTPATIENT_CLINIC_OR_DEPARTMENT_OTHER): Payer: Self-pay | Admitting: Emergency Medicine

## 2020-06-07 DIAGNOSIS — Z21 Asymptomatic human immunodeficiency virus [HIV] infection status: Secondary | ICD-10-CM | POA: Insufficient documentation

## 2020-06-07 DIAGNOSIS — Z7952 Long term (current) use of systemic steroids: Secondary | ICD-10-CM | POA: Diagnosis not present

## 2020-06-07 DIAGNOSIS — M79672 Pain in left foot: Secondary | ICD-10-CM | POA: Insufficient documentation

## 2020-06-07 DIAGNOSIS — Z7984 Long term (current) use of oral hypoglycemic drugs: Secondary | ICD-10-CM | POA: Insufficient documentation

## 2020-06-07 DIAGNOSIS — M79604 Pain in right leg: Secondary | ICD-10-CM | POA: Diagnosis not present

## 2020-06-07 DIAGNOSIS — S42294A Other nondisplaced fracture of upper end of right humerus, initial encounter for closed fracture: Secondary | ICD-10-CM | POA: Insufficient documentation

## 2020-06-07 DIAGNOSIS — Z794 Long term (current) use of insulin: Secondary | ICD-10-CM | POA: Insufficient documentation

## 2020-06-07 DIAGNOSIS — W108XXA Fall (on) (from) other stairs and steps, initial encounter: Secondary | ICD-10-CM | POA: Diagnosis not present

## 2020-06-07 DIAGNOSIS — J45909 Unspecified asthma, uncomplicated: Secondary | ICD-10-CM | POA: Insufficient documentation

## 2020-06-07 DIAGNOSIS — Z79899 Other long term (current) drug therapy: Secondary | ICD-10-CM | POA: Diagnosis not present

## 2020-06-07 DIAGNOSIS — S62234A Other nondisplaced fracture of base of first metacarpal bone, right hand, initial encounter for closed fracture: Secondary | ICD-10-CM | POA: Diagnosis not present

## 2020-06-07 DIAGNOSIS — E119 Type 2 diabetes mellitus without complications: Secondary | ICD-10-CM | POA: Insufficient documentation

## 2020-06-07 DIAGNOSIS — S4991XA Unspecified injury of right shoulder and upper arm, initial encounter: Secondary | ICD-10-CM | POA: Diagnosis present

## 2020-06-07 DIAGNOSIS — S62291A Other fracture of first metacarpal bone, right hand, initial encounter for closed fracture: Secondary | ICD-10-CM

## 2020-06-07 MED ORDER — ONDANSETRON 4 MG PO TBDP: 4.0000 mg | ORAL_TABLET | Freq: Three times a day (TID) | ORAL | 0 refills | Status: AC | PRN

## 2020-06-07 MED ORDER — ACETAMINOPHEN 500 MG PO TABS
1000.0000 mg | ORAL_TABLET | Freq: Once | ORAL | Status: AC
  Administered 2020-06-07: 1000 mg via ORAL
  Filled 2020-06-07: qty 2

## 2020-06-07 MED ORDER — HYDROCODONE-ACETAMINOPHEN 5-325 MG PO TABS: 1.0000 | ORAL_TABLET | Freq: Four times a day (QID) | ORAL | 0 refills | Status: AC | PRN

## 2020-06-07 NOTE — ED Triage Notes (Signed)
PT  PRESENTS TO THE ED , WITH THE C/O RIGHT LOWER LEG PAIN AND RIGHT ARM  PAIN  THE PT STATES SHE WAS WALKING DOWN SOME STEPS WHEN SHE LOST HER SLIPPED. NO LOSS LOC , GOOD PMS IN LOWER EXTREMITES .   RATES PAIN   10/10

## 2020-06-07 NOTE — ED Provider Notes (Signed)
[facility] EMERGENCY DEPARTMENT Provider Note   CSN: [id number] Arrival date & time: 06/07/20  1929     History No chief complaint on file.   Amber Clark is a 50 y.o. female has no history of HIV, diabetes, asthma who presents for evaluation of left foot pain, right ankle and right leg pain, right hand pain after mechanical fall that occurred earlier this evening.  She reports that she was walking down the steps and states that she lost her balance, causing her to fall down 3 steps.  She denies any preceding chest pain or dizziness.  She states that she did not hit her head and denies any LOC.  She is not on blood thinners.  She reports she twisted her right ankle and thinks she bent her left foot back.  Since then, she has had pain with ambulation and bearing weight.  She denies any numbness/weakness.  Denies any neck or back pain.  She denies any abdominal pain, nausea/vomiting, numbness/weakness, vision changes.  The history is provided by the patient.       Past Medical History:  Diagnosis Date  . Allergic rhinitis   . Asthma   . Diabetes (HCC)   . HIV (human immunodeficiency virus infection) (HCC)   . Hypercholesteremia   . Migraine     Patient Active Problem List   Diagnosis Date Noted  . Seasonal allergic conjunctivitis 02/18/2019  . Gastroesophageal reflux disease 02/18/2019  . Fever 08/17/2018  . Acute non-recurrent maxillary sinusitis 08/14/2018  . Cervical radiculitis 02/06/2018  . Cervical spondylosis 02/06/2018  . DDD (degenerative disc disease), cervical 02/06/2018  . Allergy to pollen 07/04/2017  . Chronic vasomotor rhinitis 07/04/2017  . Type 2 diabetes mellitus without complication, with long-term current use of insulin (HCC) 12/07/2015  . Depressive disorder, not elsewhere classified 12/07/2015  . Hyperlipidemia 12/07/2015  . Essential hypertension 12/07/2015  . Allergic rhinitis due to animal hair and dander 12/07/2015  . Migraine without  aura and without status migrainosus, not intractable 08/31/2015  . Moderate persistent asthma without complication 04/16/2015  . Other allergic rhinitis 04/16/2015  . HIV (human immunodeficiency virus infection) (HCC) 04/16/2015  . Dermatitis, contact 04/16/2015  . Abnormal uterine bleeding (AUB) 03/11/2014  . Low back pain 10/25/2012  . HIV infection (HCC) 04/01/2011    Past Surgical History:  Procedure Laterality Date  . ADENOIDECTOMY    . UTERINE FIBROID SURGERY       OB History   No obstetric history on file.     Family History  Problem Relation Age of Onset  . Allergic rhinitis Mother   . Asthma Mother   . Lupus Mother   . Hypertension Mother   . Allergic rhinitis Sister   . Heart disease Sister   . Angioedema Neg Hx   . Urticaria Neg Hx   . Immunodeficiency Neg Hx   . Eczema Neg Hx     Social History   Tobacco Use  . Smoking status: Never Smoker  . Smokeless tobacco: Never Used  Vaping Use  . Vaping Use: Never used  Substance Use Topics  . Alcohol use: No  . Drug use: No    Home Medications Prior to Admission medications   Medication Sig Start Date End Date Taking? Authorizing Provider  HYDROcodone-acetaminophen (NORCO/VICODIN) 5-325 MG tablet Take 1-2 tablets by mouth every 6 (six) hours as needed. 06/07/20  Yes Graciella Freer A, PA-C  ondansetron (ZOFRAN ODT) 4 MG disintegrating tablet Take 1 tablet (4 mg total) by  mouth every 8 (eight) hours as needed for nausea or vomiting. 06/07/20  Yes Maxwell CaulLayden, Lindsey A, PA-C  ACCU-CHEK FASTCLIX LANCETS MISC CHECK SUGARS TID 04/08/15   [provider]  amLODipine (NORVASC) 10 MG tablet TK 1 T PO  QD 03/24/15   [provider]  atenolol (TENORMIN) 25 MG tablet Take 25 mg by mouth daily. 03/21/19   [provider]  budesonide-formoterol (SYMBICORT) 80-4.5 MCG/ACT inhaler 2 puffs twice daily to prevent coughing or wheezing. Rinse, gargle and spit after use. 02/18/19   Hetty BlendAmbs, Anne M, FNP   celecoxib (CELEBREX) 200 MG capsule 1 po BID during menstrual period 07/14/16   [provider]  Cholecalciferol (D 1000) 1000 units capsule Take by mouth.    [provider]  clindamycin-benzoyl peroxide (BENZACLIN) gel APP EXT AA BID 08/01/18   [provider]  diclofenac sodium (VOLTAREN) 1 % GEL APP TO LEFT FOOT BID 12/19/17   [provider]  emtricitabine-tenofovir AF (DESCOVY) 200-25 MG tablet Take by mouth. 02/28/17   [provider]  estradiol (ESTRACE) 0.1 MG/GM vaginal cream 2 gms/vagina HS twice weekly and use small amount externally every 1-2 days 06/13/16   [provider]  famotidine (PEPCID) 40 MG tablet TK 1 T PO HS 10/27/17   [provider]  Fe Fum-FA-B Cmp-C-Zn-Mg-Mn-Cu (HEMOCYTE PLUS) 106-1 MG CAPS Take by mouth.    [provider]  ferrous sulfate 325 (65 FE) MG tablet TAKE 1 T PO ONCE D. 04/08/15   [provider]  gabapentin (NEURONTIN) 300 MG capsule TAKE 1 TO 2 CAPSULES BY MOUTH EVERY NIGHT AT BEDTIME 05/30/16   [provider]  glucose blood test strip TEST BLOOD SUGAR FOUR TIMES DAILY 04/25/17   [provider]  guaiFENesin (MUCINEX) 600 MG 12 hr tablet Take by mouth 2 (two) times daily.    [provider]  hydrOXYzine (ATARAX/VISTARIL) 25 MG tablet Take 1 tablet (25 mg total) by mouth at bedtime. 09/20/16   Bobbitt, Heywood Ilesalph Carter, MD  ibuprofen (ADVIL,MOTRIN) 600 MG tablet Take 1 tablet (600 mg total) by mouth every 6 (six) hours as needed. 07/12/15   Barrett HenleNadeau, Nicole Elizabeth, PA-C  insulin glargine (LANTUS) 100 UNIT/ML injection USE AS DIRECTED, MAXIMUM DAILY DOSE OF 30 UNITS 03/04/16   [provider]  Insulin Syringe-Needle U-100 (BD VEO INSULIN SYRINGE U/F) 31G X 15/64" 0.5 ML MISC Inject into the skin. 02/26/19   [provider]  ipratropium (ATROVENT) 0.06 % nasal spray Place into the nose. 07/04/17   [provider]  ISENTRESS 400 MG tablet TK 1 T  PO BID 02/24/15   [provider]  liraglutide (VICTOZA) 18 MG/3ML SOPN INJECT 1.8 MG UNDER THE SKIN DAILY 05/06/16   [provider]  loratadine (CLARITIN) 10 MG tablet Take by mouth.    [provider]  lubiprostone (AMITIZA) 24 MCG capsule Take by mouth.    [provider]  metFORMIN (GLUCOPHAGE) 500 MG tablet TAKE 2 TABLETS BY MOUTH TWICE DAILY 06/06/16   [provider]  montelukast (SINGULAIR) 10 MG tablet TAKE 1 TABLET BY MOUTH 1 TIME AT NIGHT FOR COUGHING OR WHEEZING 06/10/19   Ambs, Norvel RichardsAnne M, FNP  Olopatadine HCl (PAZEO) 0.7 % SOLN Place 1 drop into both eyes daily. 04/29/19   Hetty BlendAmbs, Anne M, FNP  Olopatadine HCl 0.2 % SOLN Place 1 drop into both eyes daily as needed. 04/30/19   Hetty BlendAmbs, Anne M, FNP  PROAIR HFA 108 [phone](90 Base) MCG/ACT inhaler Inhale 2  puffs into the lungs every 4 (four) hours as needed for wheezing or shortness of breath. 02/18/19   Hetty Blend, FNP  promethazine (PHENERGAN) 25 MG tablet Take 25 mg by mouth every 6 (six) hours as needed for nausea or vomiting.    [provider]  rizatriptan (MAXALT) 10 MG tablet Take 10 mg by mouth. 03/03/16   [provider]  simvastatin (ZOCOR) 40 MG tablet TK 1 T PO D 02/26/15   [provider]  tranexamic acid (LYSTEDA) 650 MG TABS tablet 2 po TID at start of menses, take the first few days until flow decreases, then stop 06/13/17   [provider]  tretinoin (RETIN-A) 0.025 % cream APPLY AA AT BEDTIME. 01/30/19   [provider]  valACYclovir (VALTREX) 1000 MG tablet TK 1 T PO D 02/23/15   [provider]    Allergies    Sulfa antibiotics, Sulfasalazine, Ace inhibitors, Arnuity ellipta [fluticasone propionate (inhal)], Lisinopril, Other, Penicillins, and Codeine  Review of Systems   Review of Systems  Eyes: Negative for visual disturbance.  Respiratory: Negative for cough and shortness of breath.   Cardiovascular: Negative for chest pain.   Gastrointestinal: Negative for abdominal pain, nausea and vomiting.  Musculoskeletal: Negative for back pain and neck pain.       Right hand pain Left foot pain  Right ankle pain  Skin: Positive for wound.  Neurological: Negative for weakness, numbness and headaches.  All other systems reviewed and are negative.   Physical Exam Updated Vital Signs BP 105/65 (BP Location: Right Arm)   Pulse (!) 103   Temp 98.8 F (37.1 C) (Oral)   Resp 18   Ht 5\' 1"  (1.549 m)   Wt 93.5 kg   LMP 05/28/2020   SpO2 98%   BMI 38.95 kg/m   Physical Exam Vitals and nursing note reviewed.  Constitutional:      Appearance: Normal appearance. She is well-developed.  HENT:     Head: Normocephalic and atraumatic.  Eyes:     General: Lids are normal.     Conjunctiva/sclera: Conjunctivae normal.     Pupils: Pupils are equal, round, and reactive to light.     Comments: PERRL. EOMs intact. No nystagmus. No neglect.   Neck:     Comments: Full flexion/extension and lateral movement of neck fully intact. No bony midline tenderness. No deformities or crepitus.  Cardiovascular:     Rate and Rhythm: Normal rate and regular rhythm.     Pulses: Normal pulses.          Radial pulses are 2+ on the right side and 2+ on the left side.       Dorsalis pedis pulses are 2+ on the right side and 2+ on the left side.     Heart sounds: Normal heart sounds. No murmur heard. No friction rub. No gallop.   Pulmonary:     Effort: Pulmonary effort is normal.     Breath sounds: Normal breath sounds.     Comments: Lungs clear to auscultation bilaterally.  Symmetric chest rise.  No wheezing, rales, rhonchi. Chest:     Comments: No anterior chest wall tenderness.  No deformity or crepitus noted.  No evidence of flail chest. Abdominal:     Palpations: Abdomen is soft. Abdomen is not rigid.     Tenderness: There is no abdominal tenderness. There is no guarding.     Comments: Abdomen is soft, non-distended, non-tender. No  rigidity, No guarding. No peritoneal  signs.  Musculoskeletal:        General: Normal range of motion.     Cervical back: Full passive range of motion without pain.     Comments: Tenderness palpation noted to the right hand, right thumb.  Flexion/tension of both the IP and the MCP of the right thumb intact any difficulty.  No bony tenderness noted wrist, forearm, elbow, shoulder of the right upper extremity.  No tenderness palpation noted to the left upper extremity.  No pelvic instability.  No tenderness of the bilateral femurs, bilateral knees.  Flexion/tension of both hips and knees intact light difficulty.  She has tenderness palpation noted to the right ankle with some mild overlying soft tissue swelling.  She also has some tenderness noted to the mid tib-fib.  No proximal tib-fib tenderness.  No deformity or crepitus noted.  She can wiggle all 5 toes of the right lower extremity.  Tenderness palpation of the dorsal aspect of the left foot.  No deformity or crepitus noted.  Dorsiflexion plantarflexion intact.  No bony tenderness noted to left ankle, left distal tib-fib, left proximal tib-fib.  Skin:    General: Skin is warm and dry.     Capillary Refill: Capillary refill takes less than 2 seconds.     Comments: Small abrasion noted to the palmar aspect of the right hand below the MCP of the 3rd digit.   Neurological:     Mental Status: She is alert and oriented to person, place, and time.     Comments: Cranial nerves III-XII intact Follows commands, Moves all extremities  5/5 strength to BUE and BLE  Sensation intact throughout all major nerve distributions No slurred speech. No facial droop.   Psychiatric:        Speech: Speech normal.     ED Results / Procedures / Treatments   Labs (all labs ordered are listed, but only abnormal results are displayed) Labs Reviewed - No data to display  EKG None  Radiology DG Tibia/Fibula Right  Result Date: 06/07/2020 CLINICAL DATA:  Status  post fall. EXAM: RIGHT TIBIA AND FIBULA - 2 VIEW COMPARISON:  None. FINDINGS: There is acute fracture of the shaft of the proximal right fibula. There is no evidence of dislocation. Soft tissue swelling is seen. IMPRESSION: Acute fracture of the proximal right fibula. Electronically Signed   By: Aram Candela M.D.   On: 06/07/2020 21:36   DG Ankle Complete Right  Result Date: 06/07/2020 CLINICAL DATA:  Status post fall. EXAM: RIGHT ANKLE - COMPLETE 3+ VIEW COMPARISON:  None. FINDINGS: There is no evidence of acute fracture, dislocation, or joint effusion. A linear lucency is seen overlying the posterior malleolus on the lateral view, without associated cortical defect. There is no evidence of arthropathy or other focal bone abnormality. Moderate severity anterior and lateral soft tissue swelling is seen. IMPRESSION: 1. Soft tissue swelling without evidence of an acute fracture or dislocation. Electronically Signed   By: Aram Candela M.D.   On: 06/07/2020 21:39   DG Hand Complete Right  Result Date: 06/07/2020 CLINICAL DATA:  Larey Seat down 3 steps, right hand pain greatest at thumb EXAM: RIGHT HAND - COMPLETE 3+ VIEW COMPARISON:  None. FINDINGS: Frontal, oblique, and lateral views of the right hand are obtained. On the frontal view, there is subtle cortical discontinuity within the distal medial sesamoid adjacent to the first metacarpal, consistent with fracture. No other acute bony abnormalities. There is mild osteoarthritis, most pronounced throughout the distal interphalangeal joints and first  metacarpophalangeal joint. Soft tissues are unremarkable. IMPRESSION: 1. Likely small nondisplaced fracture of the lateral distal sesamoid adjacent to the first metacarpal as above. 2. Mild osteoarthritis. Electronically Signed   By: Sharlet Salina M.D.   On: 06/07/2020 21:37   DG Foot Complete Left  Result Date: 06/07/2020 CLINICAL DATA:  Fall, LEFT foot soreness. EXAM: LEFT FOOT - COMPLETE 3+ VIEW  COMPARISON:  December 19, 2017 FINDINGS: There is no evidence of fracture or dislocation. Mild hallux valgus with similar appearance. Mild midfoot degenerative changes. Osteopenia. IMPRESSION: No acute osseous abnormality. Electronically Signed   By: Donzetta Kohut M.D.   On: 06/07/2020 21:37    Procedures Procedures   Medications Ordered in ED Medications  acetaminophen (TYLENOL) tablet 1,000 mg (1,000 mg Oral Given 06/07/20 2026)    ED Course  I have reviewed the triage vital signs and the nursing notes.  Pertinent labs & imaging results that were available during my care of the patient were reviewed by me and considered in my medical decision making (see chart for details).    MDM Rules/Calculators/A&P                          50 year old female who presents for evaluation of left foot, right ankle, right leg, right hand pain after mechanical fall.  Denies any head injury, LOC.  She is on a blood thinners.  No preceding chest pain or dizziness.  On initial arrival, she is afebrile, nontoxic-appearing.  Vital signs are stable.  On exam, she has a small abrasion to the palmar aspect of the right hand.  She has tenderness noted to the right ankle, right leg as well as the left foot.  Will obtain imaging. Given reassuring physical exam and per [facility] CT criteria, no imaging is indicated at this time.  Given reassuring exam and NEXUS criteria, no indication for cervical imaging at this time.   X-ray of hand shows likely small nondisplaced fracture of the lateral distal sesamoid adjacent to the first metacarpal as above. Left foot x-ray negative for any acute bony abnormality. X-ray shows acute fracture of the proximal right fibula.  Right ankle x-ray shows no acute abnormalities.  Discussed results with patient.  We will put her in a knee immobilizer.  Patient reports she has done okay with hydrocodone.  Will give short course of pain to use for acute breakthrough pain.  Patient  instructed to follow-up with Ortho as directed.  Her wound on her hand was bandaged.  I reevaluated the wound.  There is no deep laceration or wound that would be concerning for open fracture.  Additionally, this is on the palmar aspect of the hand about twos centimeters proximal to the MCP.  It does not overlie where the fracture ends.  We will put her in a brace/thumb spica. At this time, patient exhibits no emergent life-threatening condition that require further evaluation in ED. Patient had ample opportunity for questions and discussion. All patient's questions were answered with full understanding. Strict return precautions discussed. Patient expresses understanding and agreement to plan.   Portions of this note were generated with Scientist, clinical (histocompatibility and immunogenetics). Dictation errors may occur despite best attempts at proofreading.  Final Clinical Impression(s) / ED Diagnoses Final diagnoses:  Other closed nondisplaced fracture of proximal end of right humerus, initial encounter  Closed nondisplaced fracture of other part of first metacarpal bone of right hand, initial encounter    Rx / DC Orders ED Discharge  Orders         Ordered    HYDROcodone-acetaminophen (NORCO/VICODIN) 5-325 MG tablet  Every 6 hours PRN        06/07/20 2310    ondansetron (ZOFRAN ODT) 4 MG disintegrating tablet  Every 8 hours PRN        06/07/20 2310           Rosana Hoes 06/07/20 2343    Tilden Fossa, MD 06/10/20 [phone]

## 2020-06-07 NOTE — Discharge Instructions (Addendum)
You can take Tylenol or Ibuprofen as directed for pain. You can alternate Tylenol and Ibuprofen every 4 hours. If you take Tylenol at 1pm, then you can take Ibuprofen at 5pm. Then you can take Tylenol again at 9pm.   Take pain medications as directed for break through pain. Do not drive or operate machinery while taking this medication.   Take zofran for nausea.   You can be weight bearing as tolerated meaning you can use the crutches as needed.  As we discussed it is very important you follow-up with your orthopedic doctor.  Call their office arrange for an appointment.  Return emergency department worsening pain, numbness/weakness or any other worsening concerning symptoms.

## 2021-05-08 ENCOUNTER — Other Ambulatory Visit: Payer: Self-pay

## 2021-05-08 ENCOUNTER — Encounter (HOSPITAL_BASED_OUTPATIENT_CLINIC_OR_DEPARTMENT_OTHER): Payer: Self-pay | Admitting: Emergency Medicine

## 2021-05-08 ENCOUNTER — Emergency Department (HOSPITAL_BASED_OUTPATIENT_CLINIC_OR_DEPARTMENT_OTHER)
Admission: EM | Admit: 2021-05-08 | Discharge: 2021-05-08 | Disposition: A | Discharge status: Home / Self Care | Payer: 59 | Attending: Emergency Medicine | Admitting: Emergency Medicine

## 2021-05-08 DIAGNOSIS — E119 Type 2 diabetes mellitus without complications: Secondary | ICD-10-CM | POA: Insufficient documentation

## 2021-05-08 DIAGNOSIS — J45909 Unspecified asthma, uncomplicated: Secondary | ICD-10-CM | POA: Insufficient documentation

## 2021-05-08 DIAGNOSIS — Z21 Asymptomatic human immunodeficiency virus [HIV] infection status: Secondary | ICD-10-CM | POA: Insufficient documentation

## 2021-05-08 DIAGNOSIS — Z7951 Long term (current) use of inhaled steroids: Secondary | ICD-10-CM | POA: Insufficient documentation

## 2021-05-08 DIAGNOSIS — Z7984 Long term (current) use of oral hypoglycemic drugs: Secondary | ICD-10-CM | POA: Diagnosis not present

## 2021-05-08 DIAGNOSIS — Z794 Long term (current) use of insulin: Secondary | ICD-10-CM | POA: Insufficient documentation

## 2021-05-08 DIAGNOSIS — U071 COVID-19: Secondary | ICD-10-CM | POA: Insufficient documentation

## 2021-05-08 DIAGNOSIS — R0981 Nasal congestion: Secondary | ICD-10-CM | POA: Diagnosis present

## 2021-05-08 LAB — RESP PANEL BY RT-PCR (FLU A&B, COVID) ARPGX2
Influenza A by PCR: NEGATIVE
Influenza B by PCR: NEGATIVE
SARS Coronavirus 2 by RT PCR: POSITIVE — AB

## 2021-05-08 MED ORDER — MOLNUPIRAVIR EUA 200MG CAPSULE: 4.0000 | ORAL_CAPSULE | Freq: Two times a day (BID) | ORAL | 0 refills | Status: AC

## 2021-05-08 NOTE — ED Provider Notes (Signed)
[facility] Provider Note: [name], MD, FACEP  CSN: [id number] MRN: [id number] ARRIVAL: 05/08/21 at [place]: [place]  Sinus Problem   HISTORY OF PRESENT ILLNESS  05/08/21 3:37 AM Amber Clark is a 51 y.o. female with about a 1 week history of URI symptoms.  Specifically she has had nasal congestion, sinus drainage, occasional cough, feeling shaky and having altered taste.  She states the postnasal drip is made her stomach feel queasy but she has had no vomiting or diarrhea.  She is not aware of having a fever.  She is here with her mother who tested positive for here for COVID after a 1 day illness.  She is on HAART and states her CD4 count is healthy.   Past Medical History:  Diagnosis Date   Allergic rhinitis    Asthma    Diabetes (HCC)    HIV (human immunodeficiency virus infection) ([employer])    Hypercholesteremia    Migraine     Past Surgical History:  Procedure Laterality Date   ADENOIDECTOMY     UTERINE FIBROID SURGERY      Family History  Problem Relation Age of Onset   Allergic rhinitis Mother    Asthma Mother    Lupus Mother    Hypertension Mother    Allergic rhinitis Sister    Heart disease Sister    Angioedema Neg Hx    Urticaria Neg Hx    Immunodeficiency Neg Hx    Eczema Neg Hx     Social History   Tobacco Use   Smoking status: Never   Smokeless tobacco: Never  Vaping Use   Vaping Use: Never used  Substance Use Topics   Alcohol use: No   Drug use: No    Prior to Admission medications   Medication Sig Start Date End Date Taking? Authorizing Provider  molnupiravir EUA (LAGEVRIO) 200 mg CAPS capsule Take 4 capsules (800 mg total) by mouth 2 (two) times daily for 5 days. 05/08/21 05/13/21 Yes Saoirse Legere, MD  ACCU-CHEK FASTCLIX LANCETS MISC CHECK SUGARS TID 04/08/15   [provider]  amLODipine (NORVASC) 10 MG tablet TK 1 T PO  QD 03/24/15   [provider]  atenolol (TENORMIN) 25 MG  tablet Take 25 mg by mouth daily. 03/21/19   [provider]  budesonide-formoterol (SYMBICORT) 80-4.5 MCG/ACT inhaler 2 puffs twice daily to prevent coughing or wheezing. Rinse, gargle and spit after use. 02/18/19   Dara Hoyer, FNP  celecoxib (CELEBREX) 200 MG capsule 1 po BID during menstrual period 07/14/16   [provider]  Cholecalciferol (D 1000) 1000 units capsule Take by mouth.    [provider]  clindamycin-benzoyl peroxide (BENZACLIN) gel APP EXT AA BID 08/01/18   [provider]  diclofenac sodium (VOLTAREN) 1 % GEL APP TO LEFT FOOT BID 12/19/17   [provider]  emtricitabine-tenofovir AF (DESCOVY) 200-25 MG tablet Take by mouth. 02/28/17   [provider]  estradiol (ESTRACE) 0.1 MG/GM vaginal cream 2 gms/vagina HS twice weekly and use small amount externally every 1-2 days 06/13/16   [provider]  famotidine (PEPCID) 40 MG tablet TK 1 T PO HS 10/27/17   [provider]  Fe Fum-FA-B Cmp-C-Zn-Mg-Mn-Cu (HEMOCYTE PLUS) 106-1 MG CAPS Take by mouth.    [provider]  ferrous sulfate 325 (65 FE) MG tablet TAKE 1 T PO ONCE D. 04/08/15   [provider]  gabapentin (NEURONTIN) 300 MG capsule TAKE  1 TO 2 CAPSULES BY MOUTH EVERY NIGHT AT BEDTIME 05/30/16   [provider]  glucose blood test strip TEST BLOOD SUGAR FOUR TIMES DAILY 04/25/17   [provider]  guaiFENesin (MUCINEX) 600 MG 12 hr tablet Take by mouth 2 (two) times daily.    [provider]  HYDROcodone-acetaminophen (NORCO/VICODIN) 5-325 MG tablet Take 1-2 tablets by mouth every 6 (six) hours as needed. 06/07/20   Maxwell Caul, PA-C  hydrOXYzine (ATARAX/VISTARIL) 25 MG tablet Take 1 tablet (25 mg total) by mouth at bedtime. 09/20/16   Bobbitt, Heywood Iles, MD  ibuprofen (ADVIL,MOTRIN) 600 MG tablet Take 1 tablet (600 mg total) by mouth every 6 (six) hours as needed. 07/12/15   Barrett Henle, PA-C  insulin  glargine (LANTUS) 100 UNIT/ML injection USE AS DIRECTED, MAXIMUM DAILY DOSE OF 30 UNITS 03/04/16   [provider]  Insulin Syringe-Needle U-100 (BD VEO INSULIN SYRINGE U/F) 31G X 15/64" 0.5 ML MISC Inject into the skin. 02/26/19   [provider]  ipratropium (ATROVENT) 0.06 % nasal spray Place into the nose. 07/04/17   [provider]  ISENTRESS 400 MG tablet TK 1 T PO BID 02/24/15   [provider]  liraglutide (VICTOZA) 18 MG/3ML SOPN INJECT 1.8 MG UNDER THE SKIN DAILY 05/06/16   [provider]  loratadine (CLARITIN) 10 MG tablet Take by mouth.    [provider]  lubiprostone (AMITIZA) 24 MCG capsule Take by mouth.    [provider]  metFORMIN (GLUCOPHAGE) 500 MG tablet TAKE 2 TABLETS BY MOUTH TWICE DAILY 06/06/16   [provider]  montelukast (SINGULAIR) 10 MG tablet TAKE 1 TABLET BY MOUTH 1 TIME AT NIGHT FOR COUGHING OR WHEEZING 06/10/19   Ambs, Norvel Richards, FNP  Olopatadine HCl (PAZEO) 0.7 % SOLN Place 1 drop into both eyes daily. 04/29/19   Hetty Blend, FNP  Olopatadine HCl 0.2 % SOLN Place 1 drop into both eyes daily as needed. 04/30/19   Hetty Blend, FNP  ondansetron (ZOFRAN ODT) 4 MG disintegrating tablet Take 1 tablet (4 mg total) by mouth every 8 (eight) hours as needed for nausea or vomiting. 06/07/20   Maxwell Caul, PA-C  PROAIR HFA 108 (90 Base) MCG/ACT inhaler Inhale 2 puffs into the lungs every 4 (four) hours as needed for wheezing or shortness of breath. 02/18/19   Hetty Blend, FNP  promethazine (PHENERGAN) 25 MG tablet Take 25 mg by mouth every 6 (six) hours as needed for nausea or vomiting.    [provider]  rizatriptan (MAXALT) 10 MG tablet Take 10 mg by mouth. 03/03/16   [provider]  simvastatin (ZOCOR) 40 MG tablet TK 1 T PO D 02/26/15   [provider]  tranexamic acid (LYSTEDA) 650 MG TABS tablet 2 po TID at start of menses, take the first few days until flow decreases, then stop  06/13/17   [provider]  tretinoin (RETIN-A) 0.025 % cream APPLY AA AT BEDTIME. 01/30/19   [provider]  valACYclovir (VALTREX) 1000 MG tablet TK 1 T PO D 02/23/15   [provider]    Allergies Sulfa antibiotics, Sulfasalazine, Ace inhibitors, Arnuity ellipta [fluticasone furoate], Lisinopril, Other, Penicillins, and Codeine   REVIEW OF SYSTEMS  Negative except as noted here or in the History of Present Illness.   PHYSICAL EXAMINATION  Initial Vital Signs Blood pressure 129/77, pulse 87, temperature 98 F (36.7 C), temperature source Oral, resp. rate 16, height 5'  1" (1.549 m), weight 93.4 kg, SpO2 100 %.  Examination General: Well-developed, well-nourished female in no acute distress; appearance consistent with age of record HENT: normocephalic; atraumatic; nasal congestion Eyes: Normal appearance Neck: supple Heart: regular rate and rhythm Lungs: clear to auscultation bilaterally Abdomen: soft; nondistended; nontender; bowel sounds present Extremities: No deformity; full range of motion; pulses normal Neurologic: Awake, alert and oriented; motor function intact in all extremities and symmetric; no facial droop Skin: Warm and dry Psychiatric: Normal mood and affect   RESULTS  Summary of this visit's results, reviewed and interpreted by myself:   EKG Interpretation  Date/Time:    Ventricular Rate:    PR Interval:    QRS Duration:   QT Interval:    QTC Calculation:   R Axis:     Text Interpretation:         Laboratory Studies: Results for orders placed or performed during the hospital encounter of 05/08/21 (from the past 24 hour(s))  Resp Panel by RT-PCR (Flu A&B, Covid) Nasopharyngeal Swab     Status: Abnormal   Collection Time: 05/08/21  2:05 AM   Specimen: Nasopharyngeal Swab; Nasopharyngeal(NP) swabs in vial transport medium  Result Value Ref Range   SARS Coronavirus 2 by RT PCR POSITIVE (A) NEGATIVE   Influenza A by PCR  NEGATIVE NEGATIVE   Influenza B by PCR NEGATIVE NEGATIVE   Imaging Studies: No results found.  ED COURSE and MDM  Nursing notes, initial and subsequent vitals signs, including pulse oximetry, reviewed and interpreted by myself.  Vitals:   05/08/21 0201 05/08/21 0204  BP:  129/77  Pulse:  87  Resp:  16  Temp:  98 F (36.7 C)  TempSrc:  Oral  SpO2:  100%  Weight: 93.4 kg   Height: 5\' 1"  (1.549 m)    Medications - No data to display  Even though the patient may be outside the window for effective treatment for molnupiravir, given her multiple medical problems we will start her on a course.  PROCEDURES  Procedures   ED DIAGNOSES     ICD-10-CM   1. COVID-19 virus infection  U07.1          Jaidah Lomax, MD 05/08/21 0345

## 2021-05-08 NOTE — ED Triage Notes (Signed)
Pt c/o sinus drainage, cough, feeling shaky, hoarse voice since last week.

## 2021-05-08 NOTE — ED Notes (Signed)
Patient discharged to home.  All discharge instructions reviewed.  Patient verbalized understanding via teachback method.  VS WDL.  Respirations even and unlabored.  Ambulatory out of ED.   °

## 2021-06-02 IMAGING — DX DG TIBIA/FIBULA 2V*R*
2 series · 2 of 2 positions shown · non-contrast
Comparison: None.

CLINICAL DATA: Status post fall.

EXAM:
RIGHT TIBIA AND FIBULA - 2 VIEW

[tibia ap]
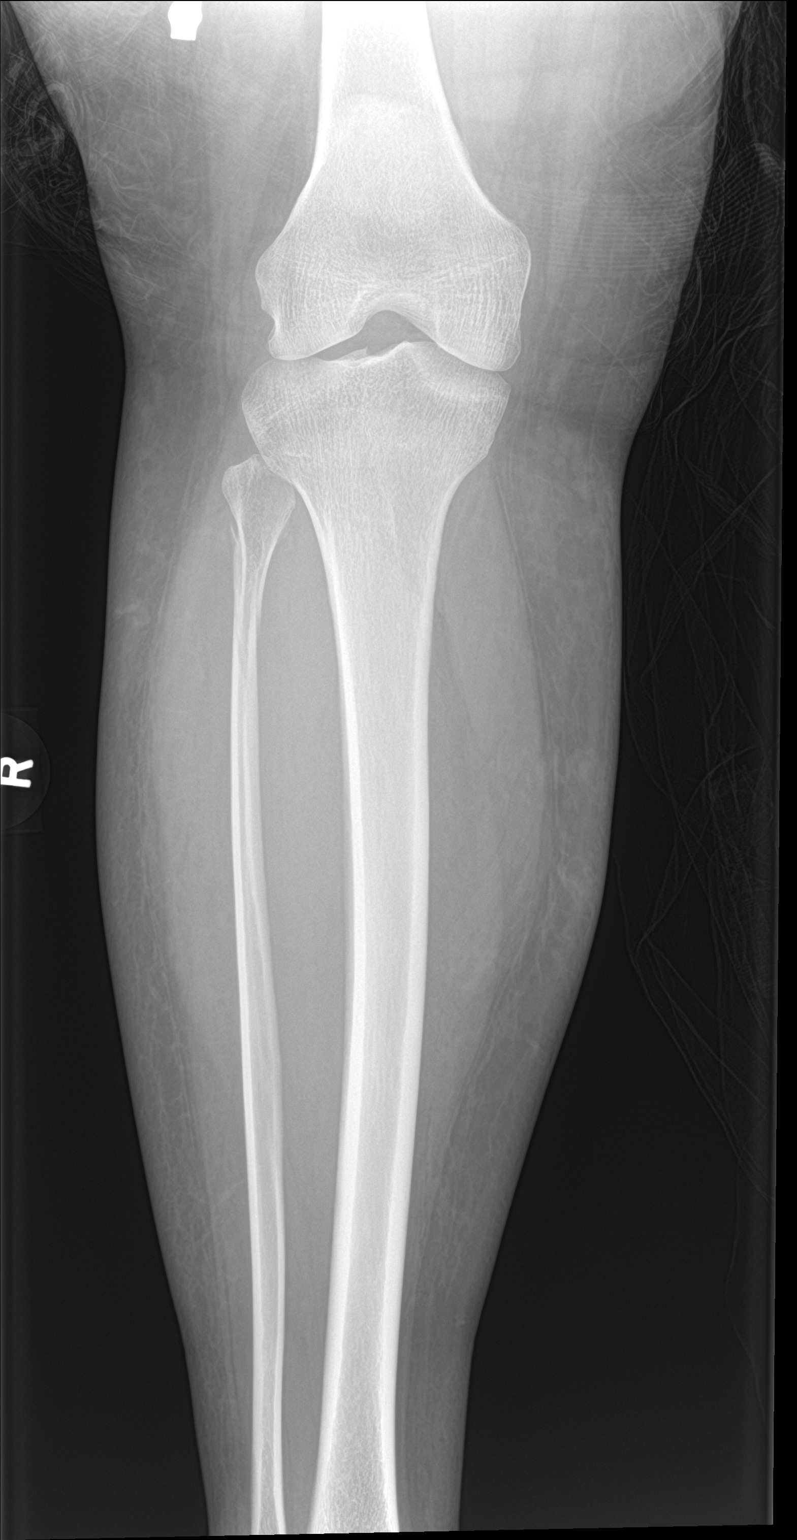

[tibia lat]
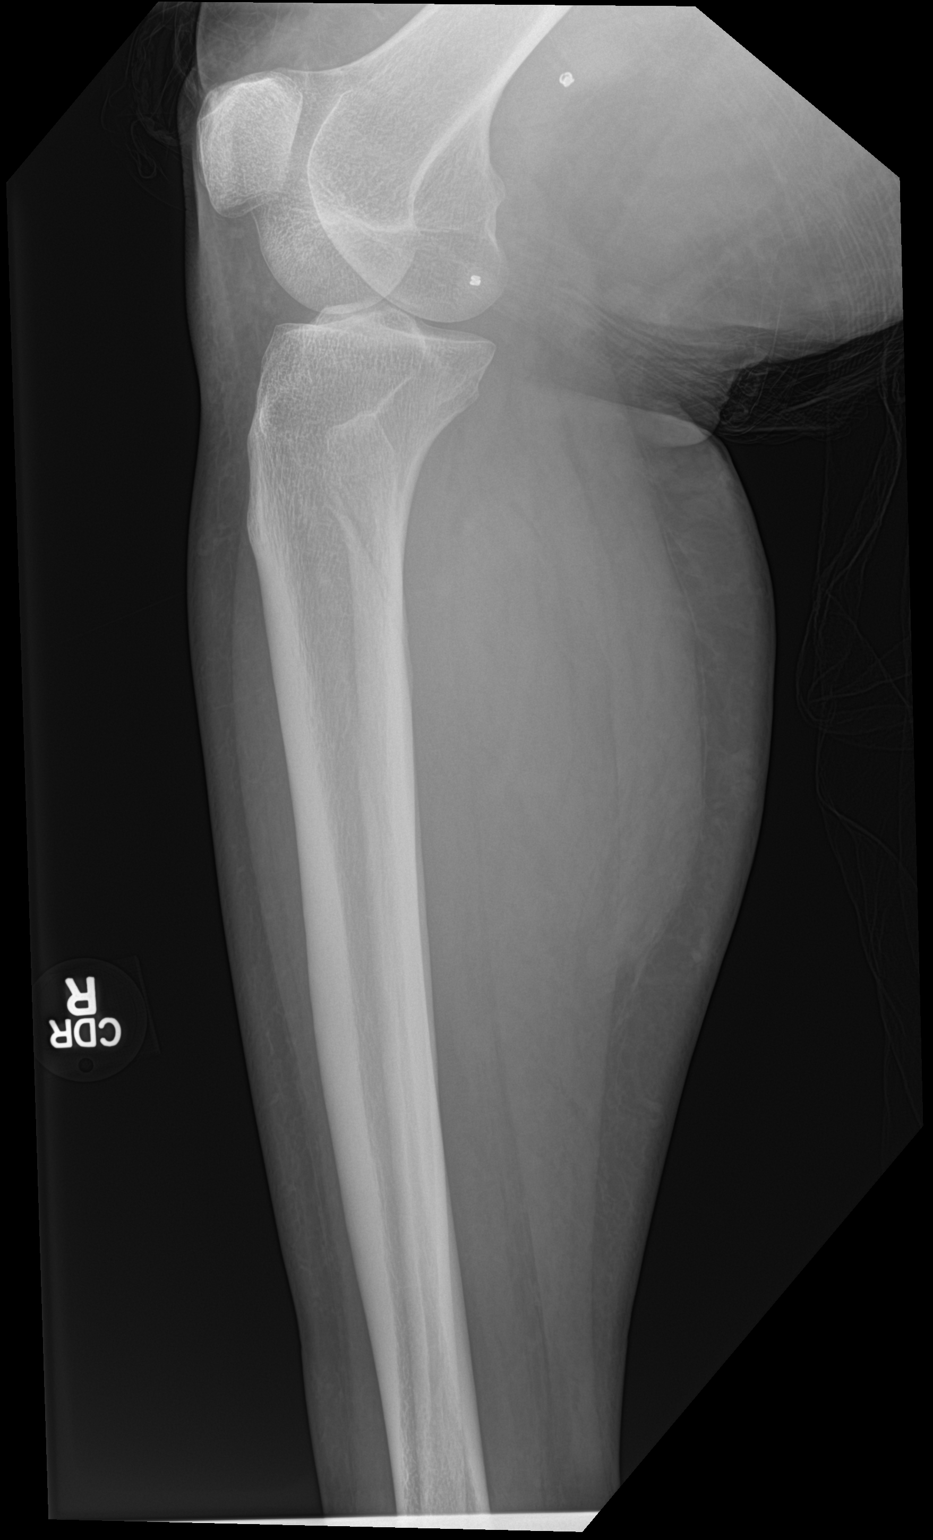

[2 of 2 positions shown; findings below may reference images not displayed]

FINDINGS: There is acute fracture of the shaft of the proximal right fibula.
There is no evidence of dislocation. Soft tissue swelling is seen.
IMPRESSION: Acute fracture of the proximal right fibula.

## 2023-01-09 ENCOUNTER — Ambulatory Visit: Payer: Medicare Other | Admitting: Podiatry

## 2023-01-16 ENCOUNTER — Ambulatory Visit: Payer: 59 | Admitting: Podiatry

## 2023-01-30 ENCOUNTER — Encounter: Payer: Self-pay | Admitting: Podiatry

## 2023-01-30 ENCOUNTER — Ambulatory Visit (INDEPENDENT_AMBULATORY_CARE_PROVIDER_SITE_OTHER): Payer: 59 | Admitting: Podiatry

## 2023-01-30 ENCOUNTER — Ambulatory Visit (INDEPENDENT_AMBULATORY_CARE_PROVIDER_SITE_OTHER): Payer: 59

## 2023-01-30 DIAGNOSIS — E119 Type 2 diabetes mellitus without complications: Secondary | ICD-10-CM | POA: Diagnosis not present

## 2023-01-30 DIAGNOSIS — E1142 Type 2 diabetes mellitus with diabetic polyneuropathy: Secondary | ICD-10-CM

## 2023-01-30 DIAGNOSIS — M79672 Pain in left foot: Secondary | ICD-10-CM

## 2023-01-30 DIAGNOSIS — Z794 Long term (current) use of insulin: Secondary | ICD-10-CM

## 2023-01-30 DIAGNOSIS — M7662 Achilles tendinitis, left leg: Secondary | ICD-10-CM | POA: Diagnosis not present

## 2023-01-30 MED ORDER — CELECOXIB 100 MG PO CAPS: 100.0000 mg | ORAL_CAPSULE | Freq: Two times a day (BID) | ORAL | 0 refills | Status: DC

## 2023-01-30 NOTE — Progress Notes (Signed)
  Subjective:  Patient ID: Amber Clark, female    DOB: Feb 23, 1971,   MRN: [id number]  No chief complaint on file.   52 y.o. female presents for concern of left foot and heel pain that has been ongoing for 4 months.  . Relates burning and tingling in their feet. Patient is diabetic and last A1c was 10.  PCP:  Chauncy Lean, PA-C    . Denies any other pedal complaints. Denies n/v/f/c.   Past Medical History:  Diagnosis Date   Allergic rhinitis    Asthma    Diabetes (HCC)    HIV (human immunodeficiency virus infection) (HCC)    Hypercholesteremia    Migraine     Objective:  Physical Exam: Vascular: DP/PT pulses 2/4 bilateral. CFT <3 seconds. Absent hair growth on digits. Edema noted to bilateral lower extremities. Xerosis noted bilaterally.  Skin. No lacerations or abrasions bilateral feet. Nails 1-5 bilateral  are normal in appearance.   Musculoskeletal: MMT 5/5 bilateral lower extremities in DF, PF, Inversion and Eversion. Deceased ROM in DF of ankle joint. Tender to insertion of achilles tendon on the left with limited DF without pain. No pain to medial calcaneal tubercle or anywhere else about the foot.  Neurological: Sensation intact to light touch. Protective sensation diminished bilateral.    Assessment:   1. Left foot pain   2. Tendonitis, Achilles, left   3. Type 2 diabetes mellitus without complication, with long-term current use of insulin (HCC)   4. Diabetic peripheral neuropathy associated with type 2 diabetes mellitus (HCC)      Plan:  Patient was evaluated and treated and all questions answered. X-rays reviewed and discussed with patient. No acute fractures or dislocations noted. 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  -Xrays reviewed -Discussed Achilles insertional tendonitis and treatment options with patient.  -Discussed stretching exercises. Referral to PT given.  -Rx Celebrex provided. Relates she has been  on this before without issues.   -Heel lifts provided and discussed proper shoewear.  -Discussed if no improvement will consider MRI/PT/EPAT/PRP injections. Discussed given A1c surgery would not be an option at this time.  -Discussed and educated patient on diabetic foot care, especially with  regards to the vascular, neurological and musculoskeletal systems.  -Stressed the importance of good glycemic control and the detriment of not  controlling glucose levels in relation to the foot. -Discussed supportive shoes at all times and checking feet regularly. -DM shoes oredered.  -Answered all patient questions -Patient to return in 2 months for check of achilles.     Louann Sjogren, DPM

## 2023-01-30 NOTE — Patient Instructions (Signed)

## 2023-02-13 ENCOUNTER — Ambulatory Visit: Payer: 59

## 2023-02-13 DIAGNOSIS — E119 Type 2 diabetes mellitus without complications: Secondary | ICD-10-CM

## 2023-02-13 DIAGNOSIS — M79672 Pain in left foot: Secondary | ICD-10-CM

## 2023-02-13 DIAGNOSIS — M7662 Achilles tendinitis, left leg: Secondary | ICD-10-CM

## 2023-02-13 NOTE — Progress Notes (Signed)
Patient presents to the office today for diabetic shoe and insole measuring.  Patient was measured with brannock device to determine size and width for 1 pair of extra depth shoes and foam casted for 3 pair of insoles.   Documentation of medical necessity will be sent to patient's treating diabetic doctor to verify and sign.   Patient's diabetic provider: Wm Smith   Shoes and insoles will be ordered at that time and patient will be notified for an appointment for fitting when they arrive.   Shoe size (per patient): 8 Brannock measurement: 7 Patient shoe selection- Shoe choice:   [id number] / [id number] Shoe size ordered: 8WD [employer]ancials signed

## 2023-02-28 ENCOUNTER — Other Ambulatory Visit: Payer: Self-pay | Admitting: Podiatry

## 2023-04-02 ENCOUNTER — Other Ambulatory Visit: Payer: Self-pay | Admitting: Podiatry

## 2023-04-10 ENCOUNTER — Ambulatory Visit: Payer: 59 | Admitting: Podiatry

## 2023-04-17 ENCOUNTER — Encounter: Payer: Self-pay | Admitting: Podiatry

## 2023-04-17 ENCOUNTER — Ambulatory Visit (INDEPENDENT_AMBULATORY_CARE_PROVIDER_SITE_OTHER): Payer: 59 | Admitting: Podiatry

## 2023-04-17 DIAGNOSIS — M7662 Achilles tendinitis, left leg: Secondary | ICD-10-CM

## 2023-04-17 DIAGNOSIS — E1142 Type 2 diabetes mellitus with diabetic polyneuropathy: Secondary | ICD-10-CM | POA: Diagnosis not present

## 2023-04-17 NOTE — Progress Notes (Signed)
  Subjective:  Patient ID: Amber Clark, female    DOB: October 27, 1970,   MRN: [id number]  No chief complaint on file.   53 y.o. female presents for concern of left foot and heel pain that has been ongoing for 6 months. Relates still doing about the same. Did get some improvement with PT but still having some pain. Patient would like to know more about PRP injections.   . Relates burning and tingling in their feet. Patient is diabetic and last A1c was 10.  PCP:  Chauncy Lean, PA-C    . Denies any other pedal complaints. Denies n/v/f/c.   Past Medical History:  Diagnosis Date   Allergic rhinitis    Asthma    Diabetes (HCC)    HIV (human immunodeficiency virus infection) (HCC)    Hypercholesteremia    Migraine     Objective:  Physical Exam: Vascular: DP/PT pulses 2/4 bilateral. CFT <3 seconds. Absent hair growth on digits. Edema noted to bilateral lower extremities. Xerosis noted bilaterally.  Skin. No lacerations or abrasions bilateral feet. Nails 1-5 bilateral  are normal in appearance.   Musculoskeletal: MMT 5/5 bilateral lower extremities in DF, PF, Inversion and Eversion. Deceased ROM in DF of ankle joint. Tender to insertion of achilles tendon on the left with limited DF without pain. No pain to medial calcaneal tubercle or anywhere else about the foot.  Neurological: Sensation intact to light touch. Protective sensation diminished bilateral.    Assessment:   1. Tendonitis, Achilles, left   2. Diabetic peripheral neuropathy associated with type 2 diabetes mellitus (HCC)       Plan:  Patient was evaluated and treated and all questions answered. X-rays reviewed and discussed with patient. No acute fractures or dislocations noted. 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  -Xrays reviewed -Discussed Achilles insertional tendonitis and treatment options with patient.  -Discussed stretching exercises. Continue outpatient stretching  from PT.  -Rx Celebrex provided. Relates she has been on this before without issues.   -Heel lifts provided and discussed proper shoewear.  -Discussed if no improvement will consider MRI/PT/EPAT/PRP injections. Discussed given A1c surgery would not be an option at this time. -She would like to consider PRP injection so will get scheduled for this.   -Discussed and educated patient on diabetic foot care, especially with  regards to the vascular, neurological and musculoskeletal systems.  -Stressed the importance of good glycemic control and the detriment of not  controlling glucose levels in relation to the foot. -Discussed supportive shoes at all times and checking feet regularly. -DM shoes oredered Awaiting.  -Answered all patient questions -Patient to return for PRP injection     Louann Sjogren, DPM

## 2023-05-01 ENCOUNTER — Encounter: Payer: Self-pay | Admitting: Podiatry

## 2023-05-01 ENCOUNTER — Ambulatory Visit (INDEPENDENT_AMBULATORY_CARE_PROVIDER_SITE_OTHER): Payer: 59 | Admitting: Podiatry

## 2023-05-01 DIAGNOSIS — E1142 Type 2 diabetes mellitus with diabetic polyneuropathy: Secondary | ICD-10-CM

## 2023-05-01 DIAGNOSIS — M7662 Achilles tendinitis, left leg: Secondary | ICD-10-CM

## 2023-05-01 NOTE — Progress Notes (Signed)
  Subjective:  Patient ID: Amber Clark, female    DOB: Sep 09, 1970,   MRN: [id number]  No chief complaint on file.   53 y.o. 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 Is not a surgical candidate so elected to try PRP injection. Here for this today.   . Relates burning and tingling in their feet. Patient is diabetic and last A1c was 10.  PCP:  Chauncy Lean, PA-C    . Denies any other pedal complaints. Denies n/v/f/c.   Past Medical History:  Diagnosis Date   Allergic rhinitis    Asthma    Diabetes (HCC)    HIV (human immunodeficiency virus infection) (HCC)    Hypercholesteremia    Migraine     Objective:  Physical Exam: Vascular: DP/PT pulses 2/4 bilateral. CFT <3 seconds. Absent hair growth on digits. Edema noted to bilateral lower extremities. Xerosis noted bilaterally.  Skin. No lacerations or abrasions bilateral feet. Nails 1-5 bilateral  are normal in appearance.   Musculoskeletal: MMT 5/5 bilateral lower extremities in DF, PF, Inversion and Eversion. Deceased ROM in DF of ankle joint. Tender to insertion of achilles tendon on the left with limited DF without pain. No pain to medial calcaneal tubercle or anywhere else about the foot.  Neurological: Sensation intact to light touch. Protective sensation diminished bilateral.    Assessment:   1. Tendonitis, Achilles, left   2. Diabetic peripheral neuropathy associated with type 2 diabetes mellitus (HCC)       Plan:  Patient was evaluated and treated and all questions answered. X-rays reviewed and discussed with patient. No acute fractures or dislocations noted. 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  -Xrays reviewed -Discussed Achilles insertional tendonitis and treatment options with patient.  -Discussed stretching exercises. Continue outpatient stretching from PT.  PRP injection as below.   -Patient to return in 4  weeks for recheck   Procedure: Injection Tendon/Ligament Platelet rich plasma  Discussed alternatives, risks, complications and verbal consent was obtained.  Location: Bilateral achilles tendon. Skin Prep: Alcohol. Injectate: 3 cc 2% lidocaine plain into each achilles tendon followed by 3cc each of PRP injectate into achilles insertion site.  Disposition: Patient tolerated procedure well. Injection site dressed with a band-aid.  Post-injection care was discussed and return precautions discussed.      Louann Sjogren, DPM

## 2023-05-12 ENCOUNTER — Other Ambulatory Visit: Payer: Self-pay | Admitting: Podiatry

## 2023-05-29 ENCOUNTER — Ambulatory Visit (INDEPENDENT_AMBULATORY_CARE_PROVIDER_SITE_OTHER): Payer: 59 | Admitting: Podiatry

## 2023-05-29 ENCOUNTER — Encounter: Payer: Self-pay | Admitting: Podiatry

## 2023-05-29 ENCOUNTER — Telehealth: Payer: Self-pay

## 2023-05-29 DIAGNOSIS — M7662 Achilles tendinitis, left leg: Secondary | ICD-10-CM

## 2023-05-29 NOTE — Progress Notes (Signed)
  Subjective:  Patient ID: Amber Clark, female    DOB: 1970-06-03,   MRN: [id number]  No chief complaint on file.   53 y.o. 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 Follow-up after PRP injection. Relates she is doing 50% better and pain not as often and able to walk now.  . Relates burning and tingling in their feet. Patient is diabetic and last A1c was  8.2.  PCP:  Chauncy Lean, PA-C    . Denies any other pedal complaints. Denies n/v/f/c.   Past Medical History:  Diagnosis Date   Allergic rhinitis    Asthma    Diabetes (HCC)    HIV (human immunodeficiency virus infection) (HCC)    Hypercholesteremia    Migraine     Objective:  Physical Exam: Vascular: DP/PT pulses 2/4 bilateral. CFT <3 seconds. Absent hair growth on digits. Edema noted to bilateral lower extremities. Xerosis noted bilaterally.  Skin. No lacerations or abrasions bilateral feet. Nails 1-5 bilateral  are normal in appearance.   Musculoskeletal: MMT 5/5 bilateral lower extremities in DF, PF, Inversion and Eversion. Deceased ROM in DF of ankle joint. Minimally tender to insertion of achilles tendon on the left with limited DF without pain. No pain to medial calcaneal tubercle or anywhere else about the foot.  Neurological: Sensation intact to light touch. Protective sensation diminished bilateral.    Assessment:   1. Tendonitis, Achilles, left        Plan:  Patient was evaluated and treated and all questions answered. X-rays reviewed and discussed with patient. No acute fractures or dislocations noted. 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  -Xrays reviewed -Discussed Achilles insertional tendonitis and treatment options with patient.  -Discussed stretching exercises. Continue outpatient stretching from PT.  -Advised continue to see how she progresses. We can consider another injection in another 4 weeks if  needed but do feel she will continue to get improvement.  -Patient to return for regular diabetic foot check in 1 year unless pain returns.       Louann Sjogren, DPM

## 2023-05-29 NOTE — Telephone Encounter (Signed)
 1st choice shoe is on back order please order 2nd choice shoe for patient [id number] if avail in Sz. 8 WD  Ppw will expire after 06/14/2023

## 2023-08-22 ENCOUNTER — Ambulatory Visit (INDEPENDENT_AMBULATORY_CARE_PROVIDER_SITE_OTHER)

## 2023-08-22 DIAGNOSIS — M2142 Flat foot [pes planus] (acquired), left foot: Secondary | ICD-10-CM

## 2023-08-22 DIAGNOSIS — M2141 Flat foot [pes planus] (acquired), right foot: Secondary | ICD-10-CM | POA: Diagnosis not present

## 2023-08-22 DIAGNOSIS — Z794 Long term (current) use of insulin: Secondary | ICD-10-CM

## 2023-08-22 DIAGNOSIS — E119 Type 2 diabetes mellitus without complications: Secondary | ICD-10-CM | POA: Diagnosis not present

## 2023-08-22 DIAGNOSIS — L84 Corns and callosities: Secondary | ICD-10-CM | POA: Diagnosis not present

## 2023-08-22 DIAGNOSIS — E1142 Type 2 diabetes mellitus with diabetic polyneuropathy: Secondary | ICD-10-CM

## 2023-09-25 NOTE — Progress Notes (Signed)

## 2023-10-23 ENCOUNTER — Encounter: Payer: Self-pay | Admitting: Podiatry

## 2023-10-23 ENCOUNTER — Ambulatory Visit (INDEPENDENT_AMBULATORY_CARE_PROVIDER_SITE_OTHER): Admitting: Podiatry

## 2023-10-23 DIAGNOSIS — B351 Tinea unguium: Secondary | ICD-10-CM | POA: Diagnosis not present

## 2023-10-23 DIAGNOSIS — M7662 Achilles tendinitis, left leg: Secondary | ICD-10-CM | POA: Diagnosis not present

## 2023-10-23 DIAGNOSIS — M79674 Pain in right toe(s): Secondary | ICD-10-CM

## 2023-10-23 DIAGNOSIS — M79675 Pain in left toe(s): Secondary | ICD-10-CM

## 2023-10-23 DIAGNOSIS — E1142 Type 2 diabetes mellitus with diabetic polyneuropathy: Secondary | ICD-10-CM | POA: Diagnosis not present

## 2023-10-23 NOTE — Progress Notes (Signed)
  Subjective:  Patient ID: Amber Clark, female    DOB: 02/25/1971,   MRN: [id number]  Chief Complaint  Patient presents with   Diabetes    I'm supposed to see why my heel is still hurting.  The pain goes up my leg. Trim my toenails. Saw Dr. Elsie Sharps - early this year; A1c - 6.4    53 y.o. 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 Has had PRP injection and still getting pain.  Also concern of thickened elongated and painful nails that are difficult to trim. Requesting to have them trimmed today. Relates burning and tingling in their feet. Patient is diabetic and last A1c was 6.4  PCP:  Neita Dover, PA-C     PCP:  Neita Dover, PA-C    . Denies any other pedal complaints. Denies n/v/f/c.   Past Medical History:  Diagnosis Date   Allergic rhinitis    Asthma    Diabetes (HCC)    HIV (human immunodeficiency virus infection) (HCC)    Hypercholesteremia    Migraine     Objective:  Physical Exam: Vascular: DP/PT pulses 2/4 bilateral. CFT <3 seconds. Absent hair growth on digits. Edema noted to bilateral lower extremities. Xerosis noted bilaterally.  Skin. No lacerations or abrasions bilateral feet. Nails 1-5 bilateral  are thickened and elongated.    Musculoskeletal: MMT 5/5 bilateral lower extremities in DF, PF, Inversion and Eversion. Deceased ROM in DF of ankle joint. Minimally tender to insertion of achilles tendon on the left with limited DF without pain. No pain to medial calcaneal tubercle or anywhere else about the foot.  Neurological: Sensation intact to light touch. Protective sensation diminished bilateral.    Assessment:   1. Tendonitis, Achilles, left   2. Diabetic peripheral neuropathy associated with type 2 diabetes mellitus (HCC)   3. Pain due to onychomycosis of toenails of both feet         Plan:  Patient was evaluated and treated and all questions answered. X-rays reviewed and  discussed with patient. No acute fractures or dislocations noted. 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  -Xrays reviewed -Discussed Achilles insertional tendonitis and treatment options with patient.  -Discussed stretching exercises. Continue outpatient stretching from PT.  -Advised continue to see how she progresses. We can consider another injection but patient does not have the money.  -Discussed EPAT as well but refused.  -Discussed and educated patient on diabetic foot care, especially with  regards to the vascular, neurological and musculoskeletal systems.  -Stressed the importance of good glycemic control and the detriment of not  controlling glucose levels in relation to the foot. -Discussed supportive shoes at all times and checking feet regularly.  -Mechanically debrided all nails 1-5 bilateral using sterile nail nipper and filed with dremel without incident  -Answered all patient questions -Patient to return  in 3 months for at risk foot care -Patient advised to call the office if any problems or questions arise in the meantime.      Asberry Failing, DPM

## 2023-12-04 ENCOUNTER — Other Ambulatory Visit: Payer: Self-pay | Admitting: Podiatry

## 2024-01-23 ENCOUNTER — Ambulatory Visit (INDEPENDENT_AMBULATORY_CARE_PROVIDER_SITE_OTHER): Admitting: Podiatry

## 2024-01-23 DIAGNOSIS — Z91199 Patient's noncompliance with other medical treatment and regimen due to unspecified reason: Secondary | ICD-10-CM

## 2024-01-23 NOTE — Progress Notes (Signed)
 Cancel 24 hours

## 2024-02-05 ENCOUNTER — Encounter: Payer: Self-pay | Admitting: Podiatry

## 2024-02-05 ENCOUNTER — Ambulatory Visit (INDEPENDENT_AMBULATORY_CARE_PROVIDER_SITE_OTHER): Admitting: Podiatry

## 2024-02-05 DIAGNOSIS — B351 Tinea unguium: Secondary | ICD-10-CM | POA: Diagnosis not present

## 2024-02-05 DIAGNOSIS — M79674 Pain in right toe(s): Secondary | ICD-10-CM

## 2024-02-05 DIAGNOSIS — M7662 Achilles tendinitis, left leg: Secondary | ICD-10-CM | POA: Diagnosis not present

## 2024-02-05 DIAGNOSIS — E1142 Type 2 diabetes mellitus with diabetic polyneuropathy: Secondary | ICD-10-CM | POA: Diagnosis not present

## 2024-02-05 DIAGNOSIS — M79675 Pain in left toe(s): Secondary | ICD-10-CM | POA: Diagnosis not present

## 2024-02-05 NOTE — Progress Notes (Signed)
  Subjective:  Patient ID: Amber Clark, female    DOB: [birth year],   MRN: [id number]  Chief Complaint  Patient presents with   Diabetes    It's a follow-up.  I need my toenails clipped. Saw Dr. Elsie Sharps 07/20/2023; A1c - 6.5    53 y.o. 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 Has had PRP injection and still getting pain. Relates has off and on days. Has been taking celebrex .  Also concern of thickened elongated and painful nails that are difficult to trim. Requesting to have them trimmed today. Relates burning and tingling in their feet. Patient is diabetic and last A1c was 6.4  PCP:  Neita Dover, PA-C     PCP:  Neita Dover, PA-C    . Denies any other pedal complaints. Denies n/v/f/c.   Past Medical History:  Diagnosis Date   Allergic rhinitis    Asthma    Diabetes (HCC)    HIV (human immunodeficiency virus infection) (HCC)    Hypercholesteremia    Migraine     Objective:  Physical Exam: Vascular: DP/PT pulses 2/4 bilateral. CFT <3 seconds. Absent hair growth on digits. Edema noted to bilateral lower extremities. Xerosis noted bilaterally.  Skin. No lacerations or abrasions bilateral feet. Nails 1-5 bilateral  are thickened and elongated.    Musculoskeletal: MMT 5/5 bilateral lower extremities in DF, PF, Inversion and Eversion. Deceased ROM in DF of ankle joint. Minimally tender to insertion of achilles tendon on the left with limited DF without pain. No pain to medial calcaneal tubercle or anywhere else about the foot.  Neurological: Sensation intact to light touch. Protective sensation diminished bilateral.    Assessment:   1. Tendonitis, Achilles, left   2. Pain due to onychomycosis of toenails of both feet   3. Diabetic peripheral neuropathy associated with type 2 diabetes mellitus (HCC)          Plan:  Patient was evaluated and treated and all questions answered. X-rays reviewed and  discussed with patient. No acute fractures or dislocations noted. 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   -Discussed Achilles insertional tendonitis and treatment options with patient.  -Discussed stretching exercises. Continue outpatient stretching from PT.  -Advised continue to see how she progresses. We can consider another injection but patient does not have the money.  -Discussed EPAT and alternative options. Declines at this time.  Discussed CMO and will consider getting fitted.  -Discussed and educated patient on diabetic foot care, especially with  regards to the vascular, neurological and musculoskeletal systems.  -Stressed the importance of good glycemic control and the detriment of not  controlling glucose levels in relation to the foot. -Discussed supportive shoes at all times and checking feet regularly.  -Mechanically debrided all nails 1-5 bilateral using sterile nail nipper and filed with dremel without incident  -Answered all patient questions -Patient to return  in 3 months for at risk foot care -Patient advised to call the office if any problems or questions arise in the meantime.      Asberry Failing, DPM

## 2024-02-12 ENCOUNTER — Other Ambulatory Visit

## 2024-03-04 ENCOUNTER — Ambulatory Visit (INDEPENDENT_AMBULATORY_CARE_PROVIDER_SITE_OTHER): Admitting: Podiatrist

## 2024-03-04 DIAGNOSIS — M7662 Achilles tendinitis, left leg: Secondary | ICD-10-CM

## 2024-03-04 DIAGNOSIS — M2141 Flat foot [pes planus] (acquired), right foot: Secondary | ICD-10-CM

## 2024-03-04 DIAGNOSIS — M2142 Flat foot [pes planus] (acquired), left foot: Secondary | ICD-10-CM

## 2024-03-04 NOTE — Progress Notes (Signed)
 Patient presents today to be scanned for orthotics.  I went over the financial responsibility form as well as the cash price if not covered by insurance.  She elected to hold off on ordering for now and call her insurance to find out her benefit coverage.  I gave her the orthotic estimate sheet with codes for tendonitis and pes plano valgus along with the L3020 orthotic code  I did a scan and am holding it in the scanner ipad storage.  If she wants to proceed with ordering of the orthotics, she will call and let me know to proceed with the order.

## 2024-03-12 ENCOUNTER — Telehealth: Admitting: Podiatrist

## 2024-03-12 NOTE — Telephone Encounter (Signed)
 Patient called and said that she spoke with her insurance and they will cover the cost of her orthotics, so she would like to proceed with her order.

## 2024-03-14 NOTE — Telephone Encounter (Signed)
 Order submitted for orthotics. Patient will be billed when orthotics are picked up per [facility]. We will call when orthotics are in and ready to be dispensed.     Patient will benefit from custom foot orthotics to provide total contact to bilateral medial longitudinal arches to help balance and distribute body weight more evenly.  Thus reducing plantar pressure and pain.   Orthotic will encourage forefoot and rearfoot alignment.     Jacobus Colvin, DPM

## 2024-04-12 ENCOUNTER — Telehealth: Payer: Self-pay | Admitting: Podiatry

## 2024-04-12 NOTE — Telephone Encounter (Signed)
 Ortho in [place], spoke with patient, appt on 04/19/2024 in [place] office

## 2024-04-19 ENCOUNTER — Ambulatory Visit: Admitting: Podiatrist

## 2024-04-19 DIAGNOSIS — M2142 Flat foot [pes planus] (acquired), left foot: Secondary | ICD-10-CM

## 2024-04-19 DIAGNOSIS — M2141 Flat foot [pes planus] (acquired), right foot: Secondary | ICD-10-CM | POA: Diagnosis not present

## 2024-04-19 NOTE — Progress Notes (Signed)
 Amber Clark presents to pick up orthotics  \  She relates they are somewhat comfortable but she needs extra support on the lateral side of the insert as she rolls out when walking.  I did build up the outside of the insert but this is a temporary fix.  I would like her to see Lolita to get the orthotics modified with a wedge perhaps to tilt her in a more valgus attitude on the entire lateral foot bilateral.   She is also due for diabetic shoes which would also likely help out her issue as her current shoes look like they are wearing out on the lateral side.   Amber Clark will wait and speak with Lolita about all of these issues.

## 2024-04-19 NOTE — Telephone Encounter (Signed)
 Pt picked up orthotics in [place] at scheduled appt.

## 2024-05-07 ENCOUNTER — Ambulatory Visit: Admitting: Podiatry

## 2024-05-28 ENCOUNTER — Ambulatory Visit: Admitting: Podiatry
